# Patient Record
Sex: Female | Born: 1975
Health system: Southern US, Community
[De-identification: ages and names within clinical notes are randomized; demographics above are authoritative.]

## PROBLEM LIST (undated history)

## (undated) DIAGNOSIS — I10 Essential (primary) hypertension: Secondary | ICD-10-CM

## (undated) DIAGNOSIS — J302 Other seasonal allergic rhinitis: Secondary | ICD-10-CM

---

## 2006-08-01 ENCOUNTER — Other Ambulatory Visit: Admission: RE | Admit: 2006-08-01 | Discharge: 2006-08-01 | Payer: Self-pay | Admitting: Obstetrics and Gynecology

## 2007-11-26 ENCOUNTER — Other Ambulatory Visit: Admission: RE | Admit: 2007-11-26 | Discharge: 2007-11-26 | Payer: Self-pay | Admitting: Obstetrics and Gynecology

## 2008-07-29 ENCOUNTER — Other Ambulatory Visit: Admission: RE | Admit: 2008-07-29 | Discharge: 2008-07-29 | Payer: Self-pay | Admitting: Obstetrics and Gynecology

## 2009-02-02 ENCOUNTER — Inpatient Hospital Stay (HOSPITAL_COMMUNITY): Admission: AD | Admit: 2009-02-02 | Discharge: 2009-02-05 | Payer: Self-pay | Admitting: Obstetrics and Gynecology

## 2009-09-17 ENCOUNTER — Other Ambulatory Visit: Admission: RE | Admit: 2009-09-17 | Discharge: 2009-09-17 | Payer: Self-pay | Admitting: Obstetrics and Gynecology

## 2010-05-17 LAB — RH IMMUNE GLOB WKUP(>/=20WKS)(NOT WOMEN'S HOSP)

## 2010-05-17 LAB — CBC
HCT: 42.8 % (ref 36.0–46.0)
Hemoglobin: 14.4 g/dL (ref 12.0–15.0)
Hemoglobin: 14.6 g/dL (ref 12.0–15.0)
MCHC: 33.7 g/dL (ref 30.0–36.0)
MCHC: 34.1 g/dL (ref 30.0–36.0)
MCV: 95.3 fL (ref 78.0–100.0)
MCV: 96 fL (ref 78.0–100.0)
MCV: 97 fL (ref 78.0–100.0)
Platelets: 154 10*3/uL (ref 150–400)
RBC: 4.45 MIL/uL (ref 3.87–5.11)
RDW: 14 % (ref 11.5–15.5)
RDW: 14.1 % (ref 11.5–15.5)
WBC: 12.8 10*3/uL — ABNORMAL HIGH (ref 4.0–10.5)

## 2010-11-22 ENCOUNTER — Other Ambulatory Visit (HOSPITAL_COMMUNITY)
Admission: RE | Admit: 2010-11-22 | Discharge: 2010-11-22 | Disposition: A | Discharge status: Home / Self Care | Payer: 59 | Source: Ambulatory Visit | Attending: Obstetrics and Gynecology | Admitting: Obstetrics and Gynecology

## 2010-11-22 ENCOUNTER — Other Ambulatory Visit: Payer: Self-pay | Admitting: Obstetrics and Gynecology

## 2010-11-22 DIAGNOSIS — Z01419 Encounter for gynecological examination (general) (routine) without abnormal findings: Secondary | ICD-10-CM | POA: Insufficient documentation

## 2011-12-16 ENCOUNTER — Ambulatory Visit: Payer: 59

## 2011-12-16 ENCOUNTER — Ambulatory Visit
Admission: RE | Admit: 2011-12-16 | Discharge: 2011-12-16 | Disposition: A | Discharge status: Home / Self Care | Payer: 59 | Source: Ambulatory Visit | Attending: Family Medicine | Admitting: Family Medicine

## 2011-12-16 ENCOUNTER — Ambulatory Visit (INDEPENDENT_AMBULATORY_CARE_PROVIDER_SITE_OTHER): Payer: No Typology Code available for payment source | Admitting: Family Medicine

## 2011-12-16 VITALS — BP 118/78 | HR 84 | Temp 98.5°F | Resp 18 | Ht 63.5 in | Wt 150.2 lb

## 2011-12-16 DIAGNOSIS — R109 Unspecified abdominal pain: Secondary | ICD-10-CM

## 2011-12-16 DIAGNOSIS — R110 Nausea: Secondary | ICD-10-CM

## 2011-12-16 LAB — POCT CBC
HCT, POC: 49.9 % — AB (ref 37.7–47.9)
Lymph, poc: 1.3 (ref 0.6–3.4)
MCHC: 32.1 g/dL (ref 31.8–35.4)
MID (cbc): 0.5 (ref 0–0.9)
MPV: 9 fL (ref 0–99.8)
POC Granulocyte: 7.4 — AB (ref 2–6.9)
POC LYMPH PERCENT: 14.1 %L (ref 10–50)
POC MID %: 5 %M (ref 0–12)
Platelet Count, POC: 326 10*3/uL (ref 142–424)
RDW, POC: 12.8 %

## 2011-12-16 LAB — POCT URINALYSIS DIPSTICK
Bilirubin, UA: NEGATIVE
Leukocytes, UA: NEGATIVE
Nitrite, UA: NEGATIVE
Protein, UA: NEGATIVE
pH, UA: 5.5

## 2011-12-16 LAB — POCT UA - MICROSCOPIC ONLY: Yeast, UA: NEGATIVE

## 2011-12-16 MED ORDER — IOHEXOL 300 MG/ML  SOLN
30.0000 mL | Freq: Once | INTRAMUSCULAR | Status: AC | PRN
  Administered 2011-12-16: 30 mL via ORAL

## 2011-12-16 MED ORDER — IOHEXOL 300 MG/ML  SOLN
100.0000 mL | Freq: Once | INTRAMUSCULAR | Status: AC | PRN
  Administered 2011-12-16: 100 mL via INTRAVENOUS

## 2011-12-16 MED ORDER — ONDANSETRON HCL 8 MG PO TABS: 8.0000 mg | ORAL_TABLET | Freq: Three times a day (TID) | ORAL | Status: DC | PRN

## 2011-12-16 NOTE — Progress Notes (Signed)
[facility] [address] [place] [address] [phone]  Date:  12/16/2011   Name:  Erin Meadows   DOB:  10/10/1975   MRN:  [id number]  PCP:  No primary provider on file.    Chief Complaint: Abdominal Pain and Diarrhea   History of Present Illness:  Erin Meadows is a 36 y.o. very pleasant female patient who presents with the following:  A few weeks ago she noted abdominal pain and bloating after she ate a salad.  This got better after a day or so.   Her symptoms returned just about a week ago.  It was not as severe at the beginning, but has gotten worse over the last couple of days.   Yesterday she felt nauseaed.  No vomiting.  Her belly has felt bloated and uncomfortable. Last night she began to notice diarrhea- she had about 3 episodes.  No blood, no melena.    She has noted decreased appetite for the last week.   She has tried some OTC antiacid medication.   Her pain is more in the epigastric area.  Eating does not really change her sympotms.    She has never had any surgery. She did note some chills yesterday, but no fever.   No urinary or vaginal symptoms.  No family history of gallbladder or other GI problems. She last had diarrhea about 8:30 today. She did eat a nutra- grain bar this am.   No burping or heartburn.   She is generally quite healthy  LMP was 2 weeks ago.  She is married and has a 40 year old son.  There is no problem list on file for this patient.   History reviewed. No pertinent past medical history.  History reviewed. No pertinent past surgical history.  History  Substance Use Topics  . Smoking status: Never Smoker   . Smokeless tobacco: Not on file  . Alcohol Use: Not on file    Family History  Problem Relation Age of Onset  . Hypertension Father   . Heart attack Father   . Hypertension Brother     No Known Allergies  Medication list has been reviewed and updated.  Current Outpatient Prescriptions on  File Prior to Visit  Medication Sig Dispense Refill  . norethindrone-ethinyl estradiol-iron (ESTROSTEP FE,TILIA FE,TRI-LEGEST FE) 1-20/1-30/1-35 MG-MCG tablet Take 1 tablet by mouth daily.        Review of Systems:  As per HPI- otherwise negative.   Physical Examination: Filed Vitals:   12/16/11 0952  BP: 162/98  Pulse: 110  Temp: 98.5 F (36.9 C)  Resp: 18   Filed Vitals:   12/16/11 0952  Height: 5' 3.5" (1.613 m)  Weight: 150 lb 3.2 oz (68.13 kg)   Body mass index is 26.19 kg/(m^2). Ideal Body Weight: Weight in (lb) to have BMI = 25: 143.1   GEN: WDWN, NAD, Non-toxic, A & O x 3 HEENT: Atraumatic, Normocephalic. Neck supple. No masses, No LAD.  Bilateral TM wnl, oropharynx normal.  PEERL,EOMI.   Ears and Nose: No external deformity. CV: RRR, No M/G/R. No JVD. No thrill. No extra heart sounds. PULM: CTA B, no wheezes, crackles, rhonchi. No retractions. No resp. distress. No accessory muscle use. ABD: S, ND, +BS.  No HSM.  She has generalized abdoiminal tenderness but it is worst in her RLQ.  She has slight rebound tendernes. On re-exam she also has epigastric and LLQ tenderness, RLQ tenderness absent but continues to have mild rebound  in the RLQ.   EXTR: No c/c/e NEURO Normal gait.  PSYCH: Normally interactive. Conversant. Not depressed or anxious appearing.  Calm demeanor.  Gu: normal external and internal exam, no CMT, no uterine or adnexal masses or tenderness  [facility] reading (PRIMARY) by  Dr. Patsy Lager.  Upright abdomen- normal, no obstructive pattern or free air  ABDOMEN - 1 VIEW  Comparison: No priors.  Findings: Gas and stool are seen scattered throughout the colon  extending to the level of the distal rectum. No pathologic  distension of small bowel is noted. No gross evidence of  pneumoperitoneum.  IMPRESSION:  1. Nonobstructive bowel gas pattern.  2. No pneumoperitoneum.   Results for orders placed in visit on 12/16/11  POCT CBC      Component Value Range    WBC 9.2  4.6 - 10.2 K/uL   Lymph, poc 1.3  0.6 - 3.4   POC LYMPH PERCENT 14.1  10 - 50 %L   MID (cbc) 0.5  0 - 0.9   POC MID % 5.0  0 - 12 %M   POC Granulocyte 7.4 (*) 2 - 6.9   Granulocyte percent 80.9 (*) 37 - 80 %G   RBC 5.22  4.04 - 5.48 M/uL   Hemoglobin 16.0  12.2 - 16.2 g/dL   HCT, POC 16.1 (*) 09.6 - 47.9 %   MCV 95.5  80 - 97 fL   MCH, POC 30.7  27 - 31.2 pg   MCHC 32.1  31.8 - 35.4 g/dL   RDW, POC 04.5     Platelet Count, POC 326  142 - 424 K/uL   MPV 9.0  0 - 99.8 fL  POCT UA - MICROSCOPIC ONLY      Component Value Range   WBC, Ur, HPF, POC 0-1     RBC, urine, microscopic 0-1     Bacteria, U Microscopic trace     Mucus, UA neg     Epithelial cells, urine per micros 3-7     Crystals, Ur, HPF, POC neg     Casts, Ur, LPF, POC neg     Yeast, UA neg    POCT URINALYSIS DIPSTICK      Component Value Range   Color, UA yellow     Clarity, UA clear     Glucose, UA neg     Bilirubin, UA neg     Ketones, UA neg     Spec Grav, UA >=1.030     Blood, UA trace-intact     pH, UA 5.5     Protein, UA neg     Urobilinogen, UA 0.2     Nitrite, UA neg     Leukocytes, UA Negative    POCT URINE PREGNANCY      Component Value Range   Preg Test, Ur Negative      Assessment and Plan: 1. Abdominal  pain, other specified site  POCT CBC, POCT UA - Microscopic Only, POCT urinalysis dipstick, POCT urine pregnancy, DG Abd 1 View, CT Abdomen Pelvis W Contrast   Annabelle has noted abdominal pain and symptoms in her RLQ for about 2 days. Her presentation is not classic for appendicitis but it is a possibility.  Discussed risks and benefits of CT scan- she would like to go ahead and do this today.  She will proceed to [facility] to have a CT now   COPLAND,JESSICA, MD   CT ABDOMEN AND PELVIS WITH CONTRAST  Technique: Multidetector CT imaging of the abdomen and  pelvis was performed following the standard protocol during bolus administration of intravenous contrast.  Contrast:  30mL OMNIPAQUE IOHEXOL 300 MG/ML SOLN, OMNIPAQUE IOHEXOL 300 MG/ML SOLN  Comparison: None.  Findings: Motion degraded images.  Lung bases are clear.  Liver, spleen, pancreas, and adrenal glands are within normal limits.  Gallbladder is underdistended. No intrahepatic or extrahepatic ductal dilatation.  Kidneys are within normal limits. No hydronephrosis.  No evidence of bowel obstruction. Normal appendix. Terminal ileum is within normal limits. Colon is decompressed but grossly unremarkable.  No evidence of abdominal aortic aneurysm.  No abdominopelvic ascites.  No suspicious abdominopelvic lymphadenopathy.  Uterus and bilateral ovaries are unremarkable.  Bladder is underdistended but within normal limits.  Visualized osseous structures are within normal limits.  IMPRESSION: Normal appendix. No evidence of bowel obstruction.  No CT findings to account for the patient's right lower quadrant abdominal pain.   Called and went over her report.  Everything looked ok- nothing to explain her pain.  I wonder if she may have IBS.  Will treat with zofran for her nausea.  I will check on her tomorrow. Meds ordered this encounter  Medications  . norethindrone-ethinyl estradiol-iron (ESTROSTEP FE,TILIA FE,TRI-LEGEST FE) 1-20/1-30/1-35 MG-MCG tablet    Sig: Take 1 tablet by mouth daily.  . ondansetron (ZOFRAN) 8 MG tablet    Sig: Take 1 tablet (8 mg total) by mouth every 8 (eight) hours as needed for nausea.    Dispense:  15 tablet    Refill:  0

## 2011-12-16 NOTE — Patient Instructions (Addendum)
Go for your CT scan today, to [facility]. You are a work in and you will probably have to wait for your exam. [facility] care has given prior authorization for the scan the Auth # [id number]   Driving directions to [address] [place], [place], [place] [address] [id number]  - more info    [address]  [place], [place] [address]     1. Head south on [address] toward [address]      0.5 mi    2. Sharp left onto [address]      0.6 mi    3. Turn left onto the [address] ramp      0.2 mi    4. Merge onto [address]      3.0 mi    5. Continue straight to stay on [address]      0.4 mi    6. Slight left to stay on [facility]  Destination will be on the right     1.0 mi     [address] [place], [place] [address]

## 2011-12-16 NOTE — Progress Notes (Signed)
I have called [facility] for prior auth/ care notification. The auth # for CT scan is [id number], I have called [facility] and they can not give her a scheduled time, but she can go to any of their imaging centers and be worked into the schedule.

## 2011-12-17 ENCOUNTER — Telehealth: Payer: Self-pay | Admitting: Family Medicine

## 2011-12-17 NOTE — Telephone Encounter (Signed)
She is feeling better.  No pain, but she does have some loose stools still

## 2011-12-19 ENCOUNTER — Telehealth: Payer: Self-pay | Admitting: Family Medicine

## 2011-12-19 NOTE — Telephone Encounter (Signed)
She is feeling fine

## 2012-02-23 ENCOUNTER — Other Ambulatory Visit: Payer: Self-pay | Admitting: Obstetrics and Gynecology

## 2012-02-23 ENCOUNTER — Other Ambulatory Visit (HOSPITAL_COMMUNITY)
Admission: RE | Admit: 2012-02-23 | Discharge: 2012-02-23 | Disposition: A | Discharge status: Home / Self Care | Payer: No Typology Code available for payment source | Source: Ambulatory Visit | Attending: Obstetrics and Gynecology | Admitting: Obstetrics and Gynecology

## 2012-02-23 DIAGNOSIS — Z1151 Encounter for screening for human papillomavirus (HPV): Secondary | ICD-10-CM | POA: Insufficient documentation

## 2012-02-23 DIAGNOSIS — R87810 Cervical high risk human papillomavirus (HPV) DNA test positive: Secondary | ICD-10-CM | POA: Insufficient documentation

## 2012-02-23 DIAGNOSIS — Z01419 Encounter for gynecological examination (general) (routine) without abnormal findings: Secondary | ICD-10-CM | POA: Insufficient documentation

## 2012-10-28 ENCOUNTER — Ambulatory Visit (INDEPENDENT_AMBULATORY_CARE_PROVIDER_SITE_OTHER): Payer: No Typology Code available for payment source | Admitting: Physician Assistant

## 2012-10-28 VITALS — BP 132/78 | HR 96 | Temp 98.7°F | Resp 16 | Ht 64.0 in | Wt 140.0 lb

## 2012-10-28 DIAGNOSIS — H6593 Unspecified nonsuppurative otitis media, bilateral: Secondary | ICD-10-CM

## 2012-10-28 DIAGNOSIS — H6590 Unspecified nonsuppurative otitis media, unspecified ear: Secondary | ICD-10-CM

## 2012-10-28 DIAGNOSIS — J020 Streptococcal pharyngitis: Secondary | ICD-10-CM

## 2012-10-28 DIAGNOSIS — H9203 Otalgia, bilateral: Secondary | ICD-10-CM

## 2012-10-28 DIAGNOSIS — J029 Acute pharyngitis, unspecified: Secondary | ICD-10-CM

## 2012-10-28 DIAGNOSIS — H9209 Otalgia, unspecified ear: Secondary | ICD-10-CM

## 2012-10-28 MED ORDER — FLUTICASONE PROPIONATE 50 MCG/ACT NA SUSP: 2.0000 | Freq: Every day | NASAL | Status: DC

## 2012-10-28 MED ORDER — AMOXICILLIN 875 MG PO TABS: 875.0000 mg | ORAL_TABLET | Freq: Two times a day (BID) | ORAL | Status: DC

## 2012-10-28 MED ORDER — PSEUDOEPHEDRINE HCL 60 MG PO TABS: 60.0000 mg | ORAL_TABLET | Freq: Four times a day (QID) | ORAL | Status: DC | PRN

## 2012-10-28 NOTE — Patient Instructions (Addendum)
Take the amoxicillin as directed.  Be sure to finish the full course even if you begin feeling better.  Plenty of fluids and rest.  If anyone else at home begins having sore throat, bring them in so we can test and possibly treat them.  Fluticasone nasal spray - 2 sprays each nostril twice daily to help relieve pressure in your ears.  Also use the pseudoephedrine every 6 hours for the next 2-3 days  Please let us know if any symptoms are worsening or not improving.   Strep Throat Strep throat is an infection of the throat caused by a bacteria named Streptococcus pyogenes. Your caregiver may call the infection streptococcal "tonsillitis" or "pharyngitis" depending on whether there are signs of inflammation in the tonsils or back of the throat. Strep throat is most common in children aged 5 15 years during the cold months of the year, but it can occur in people of any age during any season. This infection is spread from person to person (contagious) through coughing, sneezing, or other close contact. SYMPTOMS   Fever or chills.  Painful, swollen, red tonsils or throat.  Pain or difficulty when swallowing.  White or yellow spots on the tonsils or throat.  Swollen, tender lymph nodes or "glands" of the neck or under the jaw.  Red rash all over the body (rare). DIAGNOSIS  Many different infections can cause the same symptoms. A test must be done to confirm the diagnosis so the right treatment can be given. A "rapid strep test" can help your caregiver make the diagnosis in a few minutes. If this test is not available, a light swab of the infected area can be used for a throat culture test. If a throat culture test is done, results are usually available in a day or two. TREATMENT  Strep throat is treated with antibiotic medicine. HOME CARE INSTRUCTIONS   Gargle with 1 tsp of salt in 1 cup of warm water, 3 4 times per day or as needed for comfort.  Family members who also have a sore throat or  fever should be tested for strep throat and treated with antibiotics if they have the strep infection.  Make sure everyone in your household washes their hands well.  Do not share food, drinking cups, or personal items that could cause the infection to spread to others.  You may need to eat a soft food diet until your sore throat gets better.  Drink enough water and fluids to keep your urine clear or pale yellow. This will help prevent dehydration.  Get plenty of rest.  Stay home from school, daycare, or work until you have been on antibiotics for 24 hours.  Only take over-the-counter or prescription medicines for pain, discomfort, or fever as directed by your caregiver.  If antibiotics are prescribed, take them as directed. Finish them even if you start to feel better. SEEK MEDICAL CARE IF:   The glands in your neck continue to enlarge.  You develop a rash, cough, or earache.  You cough up green, yellow-brown, or bloody sputum.  You have pain or discomfort not controlled by medicines.  Your problems seem to be getting worse rather than better. SEEK IMMEDIATE MEDICAL CARE IF:   You develop any new symptoms such as vomiting, severe headache, stiff or painful neck, chest pain, shortness of breath, or trouble swallowing.  You develop severe throat pain, drooling, or changes in your voice.  You develop swelling of the neck, or the skin on  the neck becomes red and tender.  You have a fever.  You develop signs of dehydration, such as fatigue, dry mouth, and decreased urination.  You become increasingly sleepy, or you cannot wake up completely. Document Released: 01/29/2000 Document Revised: 01/18/2012 Document Reviewed: 04/01/2010 [facility] Patient Information 2014 [place], [place].

## 2012-10-28 NOTE — Progress Notes (Signed)
  Subjective:    Patient ID: Erin Meadows, female    DOB: October 10, 1975, 37 y.o.   MRN: [id number]  HPI   [name] is a very pleasant 37 yr old female here with concern for illness.  Reports 4 days of feeling "really bad."  First day may have had a fever but didn't check.  Throat very sore and both ears hurting a log.  Has used some OTC ear drops which helps somewhat.  No nasal congestion, rhinorrhea, cough, GI symptoms.  No sick contacts but she does work as a Runner, broadcasting/film/video.  Has used Dayquil and Nyquil for symptoms with some relief.    Review of Systems  Constitutional: Negative for fever and chills.  HENT: Positive for ear pain, congestion and sore throat.   Respiratory: Negative for cough, shortness of breath and wheezing.   Cardiovascular: Negative.   Gastrointestinal: Negative.   Musculoskeletal: Negative.   Skin: Negative.   Neurological: Negative.        Objective:   Physical Exam  Vitals reviewed. Constitutional: She is oriented to person, place, and time. She appears well-developed and well-nourished. No distress.  HENT:  Head: Normocephalic and atraumatic.  Right Ear: Ear canal normal. A middle ear effusion is present.  Left Ear: Ear canal normal. A middle ear effusion is present.  Mouth/Throat: Uvula is midline and mucous membranes are normal. Posterior oropharyngeal erythema present. No oropharyngeal exudate or posterior oropharyngeal edema.  Neck: Neck supple.  Cardiovascular: Normal rate, regular rhythm and normal heart sounds.   Pulmonary/Chest: Effort normal and breath sounds normal. She has no wheezes. She has no rales.  Lymphadenopathy:    She has no cervical adenopathy.  Neurological: She is alert and oriented to person, place, and time.  Skin: Skin is warm and dry.  Psychiatric: She has a normal mood and affect. Her behavior is normal.     Results for orders placed in visit on 10/28/12  POCT RAPID STREP A (OFFICE)      Result Value Range   Rapid Strep A Screen  Positive (*) Negative       Assessment & Plan:  Strep throat - Plan: amoxicillin (AMOXIL) 875 MG tablet  Sore throat - Plan: POCT rapid strep A, CANCELED: Culture, Group A Strep  Otalgia of both ears - Plan: fluticasone (FLONASE) 50 MCG/ACT nasal spray, pseudoephedrine (SUDAFED) 60 MG tablet  Middle ear effusion, bilateral - Plan: fluticasone (FLONASE) 50 MCG/ACT nasal spray, pseudoephedrine (SUDAFED) 60 MG tablet   [name] is a very pleasant 37 yr old female here with strep throat.  Will treat with amox x 10 days.  Ibuprofen prn throat pain.  Pt has a young son who is currently asympt - encouraged her to bring him in for test/tx if he develops symptoms.  Mid-ear effusions bilat but no sign of infection today.  Will treat with Flonase and Sudafed to hopefully facilitate drainage of the middle ear.  RTC if worsening or not improving.

## 2013-03-20 ENCOUNTER — Other Ambulatory Visit (HOSPITAL_COMMUNITY)
Admission: RE | Admit: 2013-03-20 | Discharge: 2013-03-20 | Disposition: A | Discharge status: Home / Self Care | Payer: No Typology Code available for payment source | Source: Ambulatory Visit | Attending: Obstetrics and Gynecology | Admitting: Obstetrics and Gynecology

## 2013-03-20 ENCOUNTER — Other Ambulatory Visit: Payer: Self-pay | Admitting: Obstetrics and Gynecology

## 2013-03-20 DIAGNOSIS — Z01419 Encounter for gynecological examination (general) (routine) without abnormal findings: Secondary | ICD-10-CM | POA: Insufficient documentation

## 2013-07-20 ENCOUNTER — Ambulatory Visit: Payer: No Typology Code available for payment source | Admitting: Family Medicine

## 2013-07-20 VITALS — BP 126/74 | HR 101 | Temp 98.2°F | Ht 63.0 in | Wt 147.0 lb

## 2013-07-20 DIAGNOSIS — J029 Acute pharyngitis, unspecified: Secondary | ICD-10-CM

## 2013-07-20 DIAGNOSIS — H109 Unspecified conjunctivitis: Secondary | ICD-10-CM

## 2013-07-20 LAB — POCT RAPID STREP A (OFFICE): Rapid Strep A Screen: NEGATIVE

## 2013-07-20 MED ORDER — TOBRAMYCIN 0.3 % OP SOLN: 1.0000 [drp] | OPHTHALMIC | Status: DC

## 2013-07-20 NOTE — Progress Notes (Signed)
Patient ID: Erin HightKimberly Meadows MRN: [id number], DOB: [birth year], 38 y.o. Date of Encounter: 07/20/2013, 8:21 AM  Primary Physician: No PCP Per Patient  Chief Complaint:  Chief Complaint  Patient presents with  . Conjunctivitis    watery and matted this morning  . Otalgia    pain since yesterday  . Sore Throat    pain since yesterday    HPI: 38 y.o. year old female presents with day history of sore throat. Subjective fever and chills. No cough, congestion, rhinorrhea, sinus pressure, otalgia, or headache. Normal hearing. No GI complaints. Able to swallow saliva, but hurts to do so. Decreased appetite secondary to sore throat.   Patient [facility] [employer] grades 4-8 in math. Aref patient woke up this morning with conjunctivitis in left eye  No past medical history on file.   Home Meds: Prior to Admission medications   Medication Sig Start Date End Date Taking? Authorizing Provider  norethindrone-ethinyl estradiol-iron (ESTROSTEP FE,TILIA FE,TRI-LEGEST FE) 1-20/1-30/1-35 MG-MCG tablet Take 1 tablet by mouth daily.   Yes Historical Provider, MD  amoxicillin (AMOXIL) 875 MG tablet Take 1 tablet (875 mg total) by mouth 2 (two) times daily. 10/28/12   Eleanore Delia ChimesE Egan, PA-C  fluticasone (FLONASE) 50 MCG/ACT nasal spray Place 2 sprays into the nose daily. 10/28/12   Eleanore Delia ChimesE Egan, PA-C  pseudoephedrine (SUDAFED) 60 MG tablet Take 1 tablet (60 mg total) by mouth every 6 (six) hours as needed for congestion. 10/28/12   Eleanore Delia ChimesE Egan, PA-C    Allergies: No Known Allergies  History   Social History  . Marital Status: Married    Spouse Name: N/A    Number of Children: N/A  . Years of Education: N/A   Occupational History  . Not on file.   Social History Main Topics  . Smoking status: Never Smoker   . Smokeless tobacco: Not on file  . Alcohol Use: Not on file  . Drug Use: Not on file  . Sexual Activity: Not on file   Other Topics Concern  . Not on file   Social  History Narrative  . No narrative on file     Review of Systems: Constitutional: negative for chills, fever, night sweats or weight changes HEENT: see above Cardiovascular: negative for chest pain or palpitations Respiratory: negative for hemoptysis, wheezing, or shortness of breath Abdominal: negative for abdominal pain, nausea, vomiting or diarrhea Dermatological: negative for rash Neurologic: negative for headache   Physical Exam: Blood pressure 126/74, pulse 101, temperature 98.2 F (36.8 C), temperature source Oral, height 5\' 3"  (1.6 m), weight 147 lb (66.679 kg), last menstrual period 07/16/2013., Body mass index is 26.05 kg/(m^2). General: Well developed, well nourished, in no acute distress. Head: Normocephalic, atraumatic, eyes without discharge, sclera non-icteric, nares are patent. Bilateral auditory canals clear, TM's are without perforation, pearly grey with reflective cone of light bilaterally. No sinus TTP. Oral cavity moist, dentition normal. Posterior pharynx with post nasal drip and mild erythema. No peritonsillar abscess or tonsillar exudate. Patient has marked erythema, lip swelling and exudates in the left eye Neck: Supple. No thyromegaly. Full ROM. No lymphadenopathy.  Msk:  Strength and tone normal for age. Extremities: No clubbing or cyanosis. No edema. Neuro: Alert and oriented X 3. Moves all extremities spontaneously. CNII-XII grossly in tact. Psych:  Responds to questions appropriately with a normal affect.     ASSESSMENT AND PLAN:  38 y.o. year old female with acute pharyngitis and conjunctivitis  - -Tylenol/Motrin  prn -Rest/fluids -RTC precautions -RTC 3-5 days if no improvement  Signed, Elvina Sidle, MD 07/20/2013 8:21 AM

## 2013-07-20 NOTE — Patient Instructions (Signed)

## 2013-07-28 ENCOUNTER — Ambulatory Visit (INDEPENDENT_AMBULATORY_CARE_PROVIDER_SITE_OTHER): Payer: No Typology Code available for payment source | Admitting: Physician Assistant

## 2013-07-28 VITALS — BP 120/73 | HR 79 | Temp 99.0°F | Resp 18 | Wt 145.0 lb

## 2013-07-28 DIAGNOSIS — R059 Cough, unspecified: Secondary | ICD-10-CM

## 2013-07-28 DIAGNOSIS — J029 Acute pharyngitis, unspecified: Secondary | ICD-10-CM

## 2013-07-28 DIAGNOSIS — H6690 Otitis media, unspecified, unspecified ear: Secondary | ICD-10-CM

## 2013-07-28 DIAGNOSIS — R05 Cough: Secondary | ICD-10-CM

## 2013-07-28 MED ORDER — AMOXICILLIN 875 MG PO TABS: 875.0000 mg | ORAL_TABLET | Freq: Two times a day (BID) | ORAL | Status: DC

## 2013-07-28 MED ORDER — HYDROCODONE-HOMATROPINE 5-1.5 MG/5ML PO SYRP: 5.0000 mL | ORAL_SOLUTION | Freq: Three times a day (TID) | ORAL | Status: DC | PRN

## 2013-07-28 MED ORDER — METHYLPREDNISOLONE (PAK) 4 MG PO TABS: ORAL_TABLET | ORAL | Status: DC

## 2013-07-28 NOTE — Progress Notes (Signed)
   Subjective:    Patient ID: Erin Meadows, female    DOB: 1975/08/08, 38 y.o.   MRN: [id number]  HPI 38 year old female presents for recheck of illness. Was seen on 6/6 for sore throat and left ear pain as well as conjunctivitis. Had negative rapid strep and was placed on Tobrex drops. She used them until her eye improved and then d/c'ed.  The next day her eye became red and started draining again so she started using them again.  Admits that her sore throat and ear pain have not improved. She complains of mucous in her throat and persistent, dry, hacking cough.  No SOB, wheezing, chest pain, or hemoptysis.   Denies fever, chills, nausea, vomiting, headache, dizziness, or abdominal pain.   Taking Mucinex at onset - not helping Antihistamine daily seems ineffective.    Review of Systems  Constitutional: Negative for fever and chills.  HENT: Positive for ear pain (left), postnasal drip and sore throat. Negative for congestion, rhinorrhea and sinus pressure.   Respiratory: Positive for cough. Negative for chest tightness, shortness of breath and wheezing.   Cardiovascular: Negative for chest pain.  Gastrointestinal: Negative for nausea, vomiting and abdominal pain.  Neurological: Negative for dizziness and headaches.       Objective:   Physical Exam  Constitutional: She is oriented to person, place, and time. She appears well-developed and well-nourished.  HENT:  Head: Normocephalic and atraumatic.  Right Ear: Tympanic membrane, external ear and ear canal normal.  Left Ear: Hearing, external ear and ear canal normal. Tympanic membrane is bulging. A middle ear effusion is present.  Mouth/Throat: Uvula is midline and mucous membranes are normal. Posterior oropharyngeal erythema present. No oropharyngeal exudate, posterior oropharyngeal edema or tonsillar abscesses.  Eyes: Conjunctivae are normal.  Neck: Normal range of motion. Neck supple.  Cardiovascular: Normal rate, regular rhythm and  normal heart sounds.   Pulmonary/Chest: Effort normal and breath sounds normal.  Lymphadenopathy:    She has no cervical adenopathy.  Neurological: She is alert and oriented to person, place, and time.  Psychiatric: She has a normal mood and affect. Her behavior is normal. Judgment and thought content normal.          Assessment & Plan:  Otitis media - Plan: amoxicillin (AMOXIL) 875 MG tablet  Cough - Plan: HYDROcodone-homatropine (HYCODAN) 5-1.5 MG/5ML syrup, methylPREDNIsolone (MEDROL DOSPACK) 4 MG tablet  Acute pharyngitis - Plan: amoxicillin (AMOXIL) 875 MG tablet  Will treat with amoxicillin 875 mg bid x 10 days Hycodan qhs prn cough - caution sedation Medrol dose pack as directed Increase fluids and rest Follow up if symptoms worsening or fail to improve.

## 2013-08-02 ENCOUNTER — Ambulatory Visit (INDEPENDENT_AMBULATORY_CARE_PROVIDER_SITE_OTHER): Payer: No Typology Code available for payment source | Admitting: Physician Assistant

## 2013-08-02 VITALS — BP 114/60 | HR 80 | Temp 98.6°F | Ht 63.5 in | Wt 144.6 lb

## 2013-08-02 DIAGNOSIS — H6990 Unspecified Eustachian tube disorder, unspecified ear: Secondary | ICD-10-CM

## 2013-08-02 DIAGNOSIS — H6980 Other specified disorders of Eustachian tube, unspecified ear: Secondary | ICD-10-CM

## 2013-08-02 DIAGNOSIS — H6982 Other specified disorders of Eustachian tube, left ear: Secondary | ICD-10-CM

## 2013-08-02 DIAGNOSIS — H6992 Unspecified Eustachian tube disorder, left ear: Secondary | ICD-10-CM

## 2013-08-02 MED ORDER — IPRATROPIUM BROMIDE 0.03 % NA SOLN: 2.0000 | Freq: Two times a day (BID) | NASAL | Status: DC

## 2013-08-02 NOTE — Progress Notes (Signed)
   Subjective:    Patient ID: Erin Meadows, female    DOB: 1975/12/02, 38 y.o.   MRN: [id number]  HPI 38 year old female presents for recheck of otitis media that was diagnosed on 07/28/13.  States her other URI symptoms have improved but she continues to have left ear pain.  Admits it is some better but not resolved.       Review of Systems  Constitutional: Negative for fever and chills.  HENT: Positive for ear pain (left). Negative for congestion, ear discharge, sinus pressure and sore throat.   Eyes: Negative for discharge and visual disturbance.  Gastrointestinal: Negative for nausea and vomiting.  Neurological: Negative for dizziness and headaches.       Objective:   Physical Exam  Constitutional: She is oriented to person, place, and time. She appears well-developed and well-nourished.  HENT:  Head: Normocephalic and atraumatic.  Right Ear: Hearing, tympanic membrane, external ear and ear canal normal.  Left Ear: Hearing, tympanic membrane, external ear and ear canal normal.  Mouth/Throat: Uvula is midline, oropharynx is clear and moist and mucous membranes are normal.  Eyes: Conjunctivae are normal.  Neck: Normal range of motion.  Cardiovascular: Normal rate.   Pulmonary/Chest: Effort normal.  Neurological: She is alert and oriented to person, place, and time.  Psychiatric: She has a normal mood and affect. Her behavior is normal. Judgment and thought content normal.    Several drops of Auralgan placed in left ear. She reports this did not help her symptoms at all.       Assessment & Plan:  ETD (eustachian tube dysfunction), left - Plan: ipratropium (ATROVENT) 0.03 % nasal spray  Due to no response to Auralgan, likely secondary to ETD. Her OM does appear to be improved; TM no longer erythematous or bulging.  Will add Atrovent NS twice daily. Recommend Zyrtec-D x 2-3 days. Complete course of antibiotic.  Reassurance provided.

## 2013-10-27 IMAGING — CR DG ABDOMEN 1V
2 series · 2 of 2 positions shown · non-contrast
Comparison: No priors.

CLINICAL DATA: Abdominal pain.  Rule out obstruction.

ABDOMEN - 1 VIEW

[AP (1 of 2)]
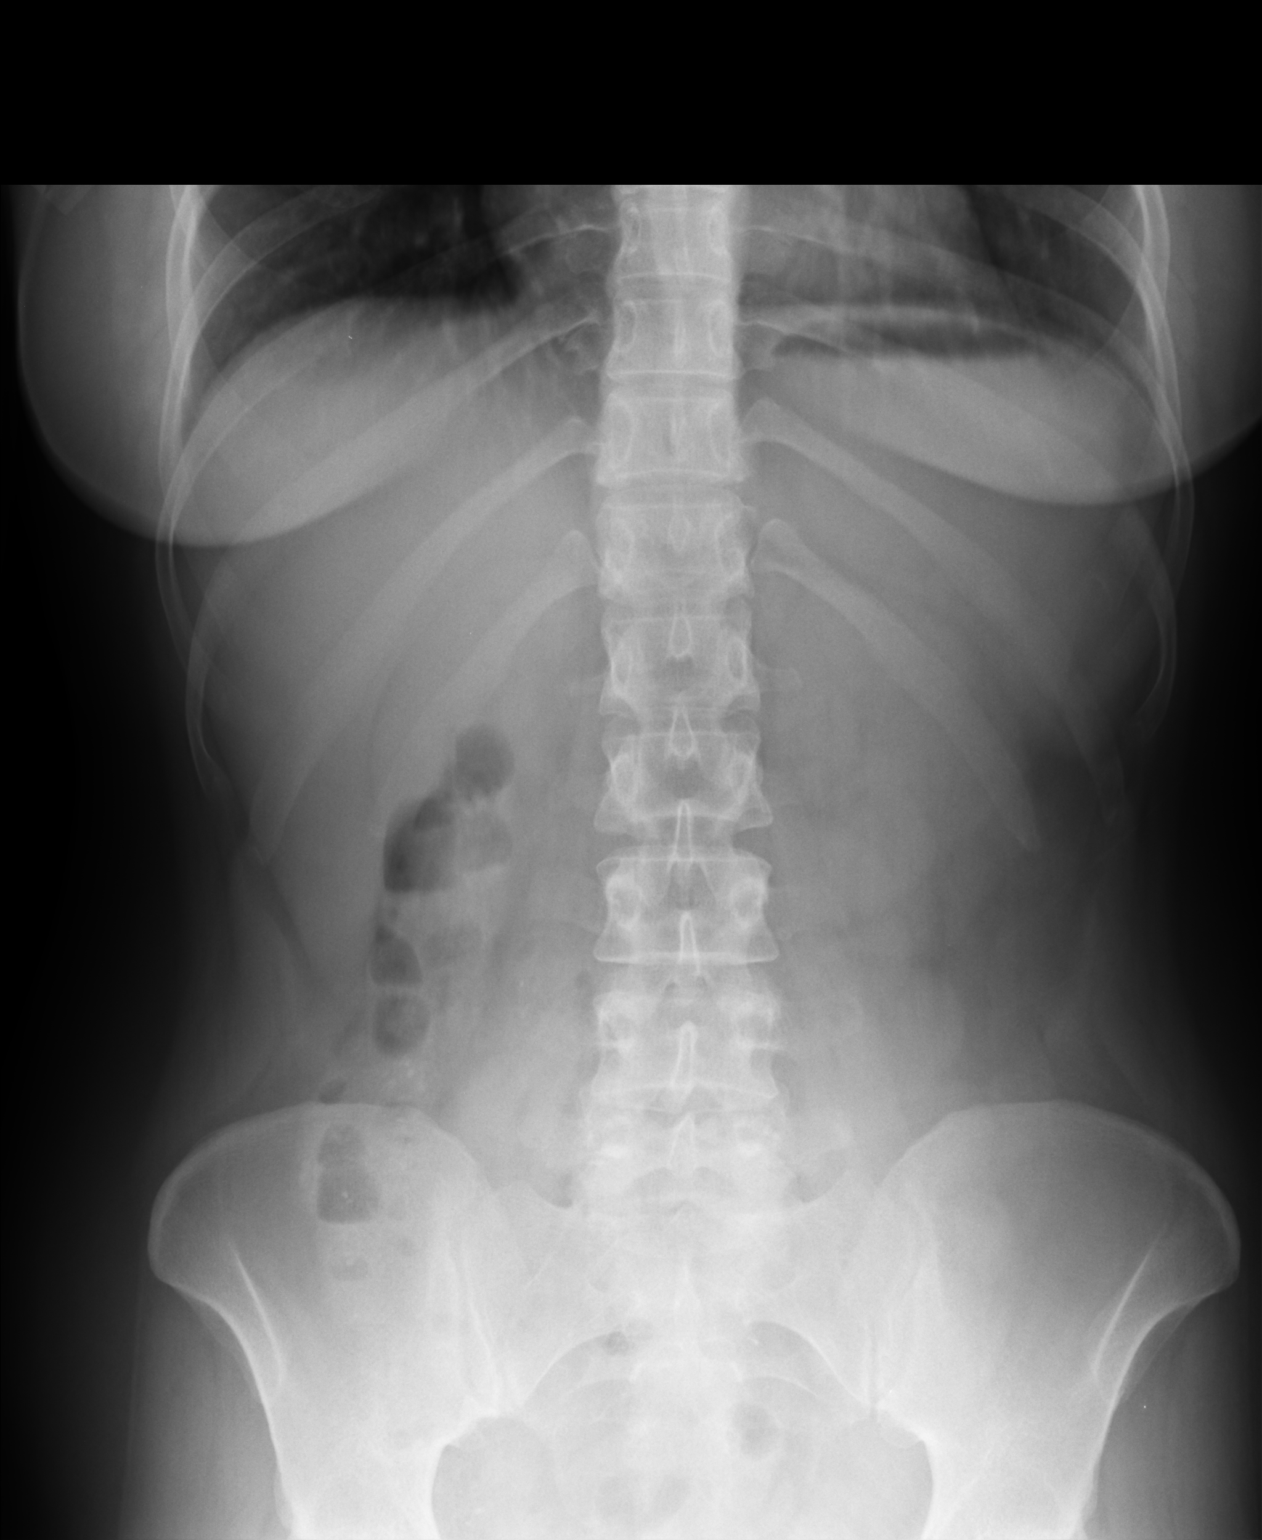

[AP (2 of 2)]
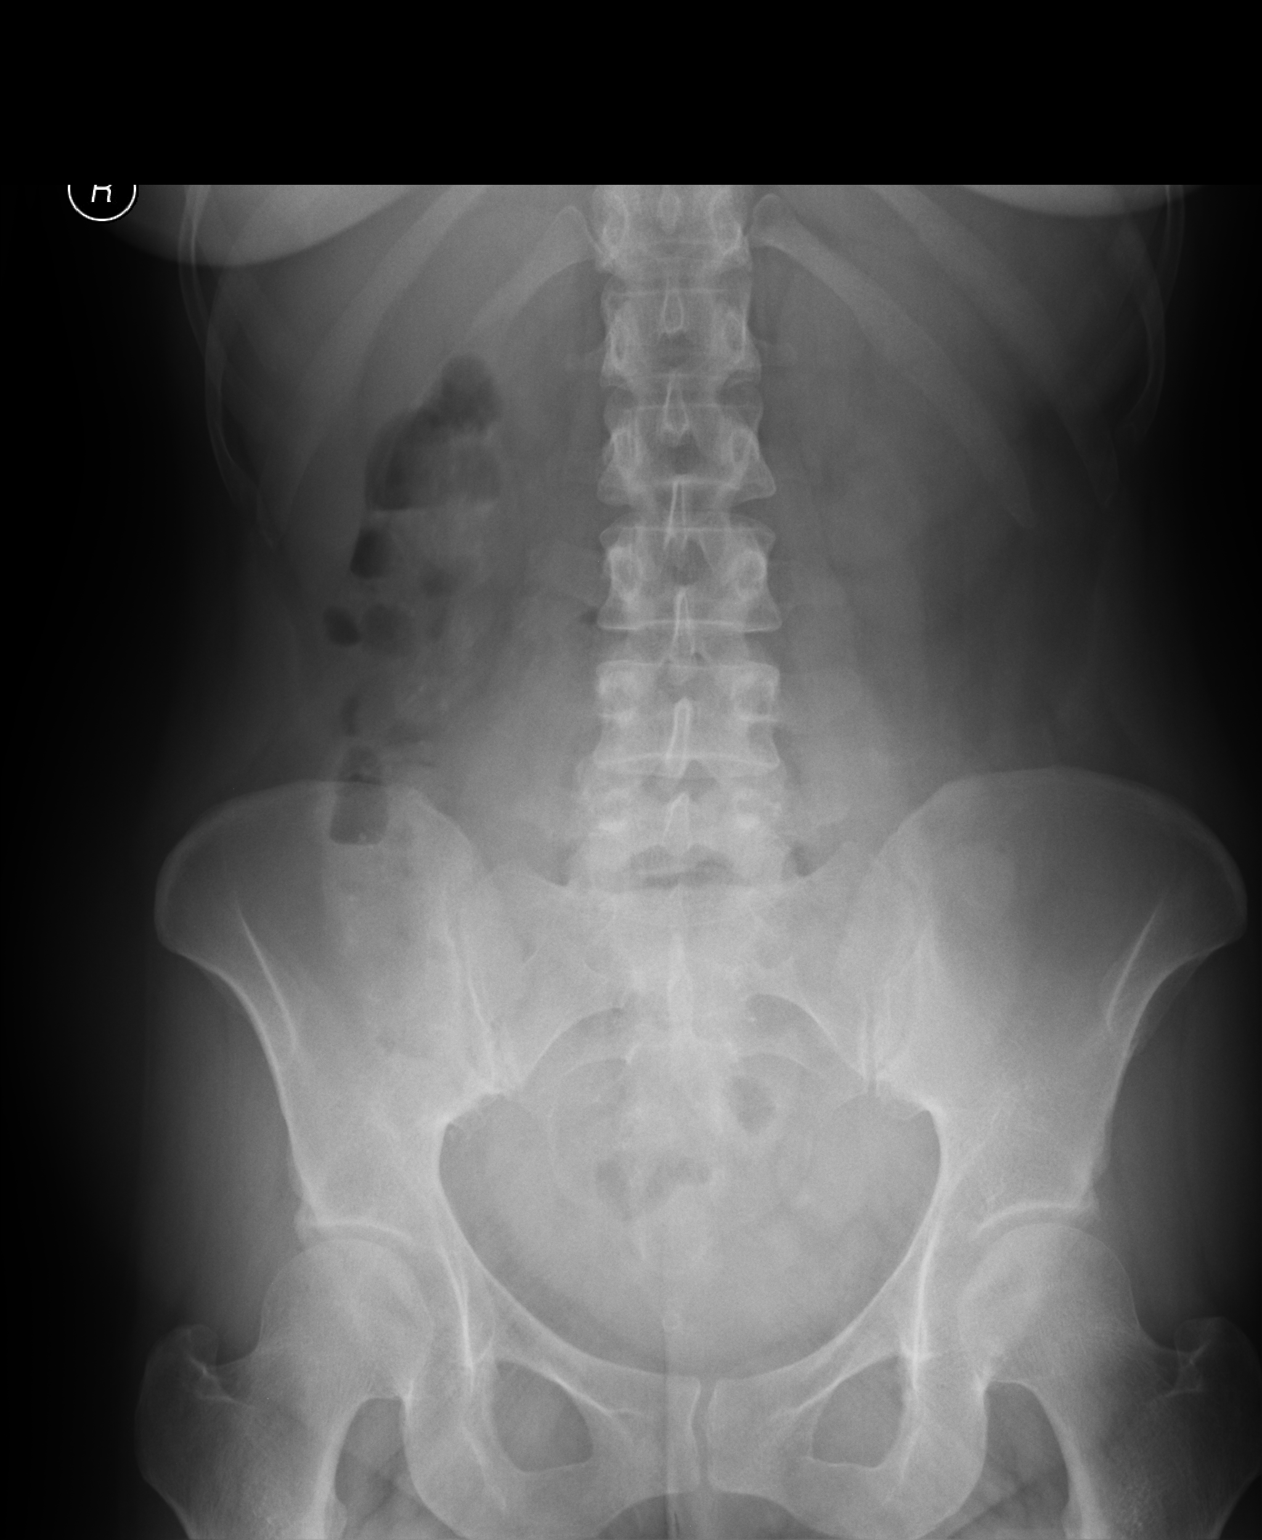

[2 of 2 positions shown; findings below may reference images not displayed]

FINDINGS: Gas and stool are seen scattered throughout the colon
extending to the level of the distal rectum.  No pathologic
distension of small bowel is noted.  No gross evidence of
pneumoperitoneum.
IMPRESSION: 1.  Nonobstructive bowel gas pattern.
2.  No pneumoperitoneum.

## 2013-10-27 IMAGING — CT CT ABD-PELV W/ CM
2 of 4 series · 13 of 32 positions shown, 18 images · IV contrast (omnipaque)
Comparison: None.

CLINICAL DATA: Right lower quadrant pain, nausea, diarrhea

CT ABDOMEN AND PELVIS WITH CONTRAST
TECHNIQUE: Multidetector CT imaging of the abdomen and pelvis was
performed following the standard protocol during bolus
administration of intravenous contrast.
Contrast: 30mL OMNIPAQUE IOHEXOL 300 MG/ML  SOLN, 100mL OMNIPAQUE
IOHEXOL 300 MG/ML  SOLN

[Series 2: abd/pelvis with · axial · 0.70mm/px · z∈[-312,-62]mm · 5 of 76 slices shown, 10 images]
[im 13/76  soft-tissue]
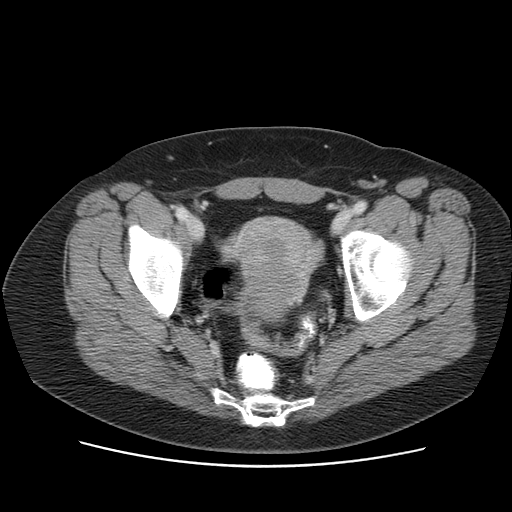
[im 13/76  bone]
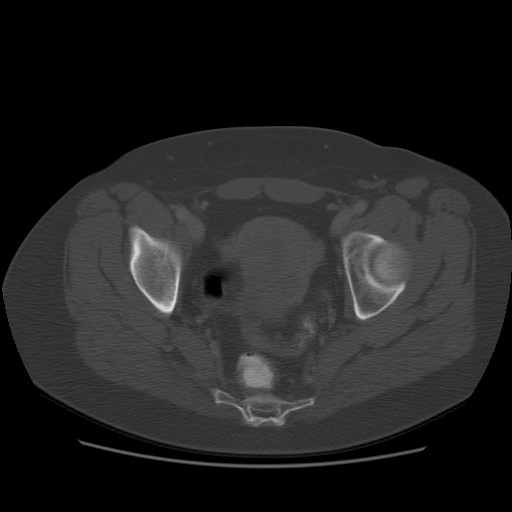
[im 26/76  soft-tissue]
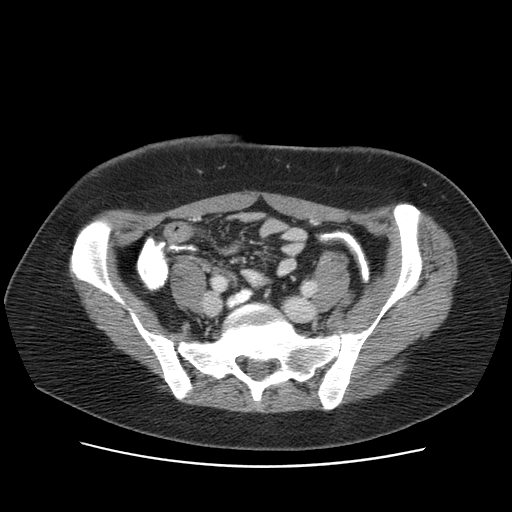
[im 26/76  lung]
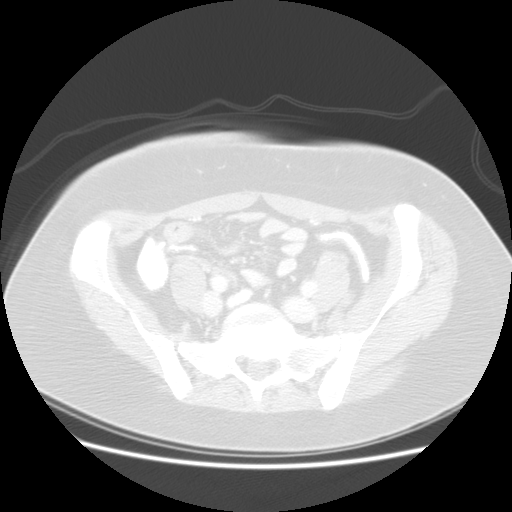
[im 38/76  soft-tissue]
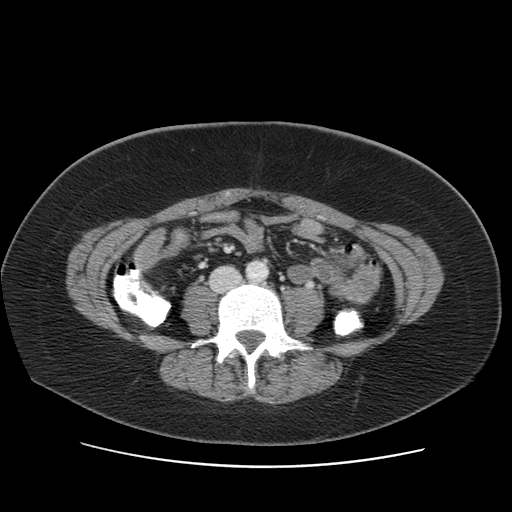
[im 38/76  lung]
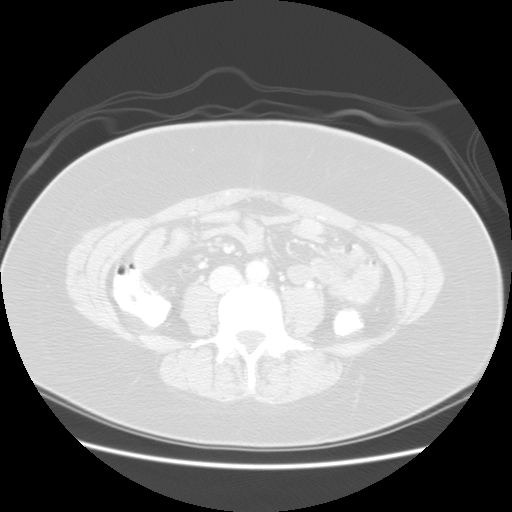
[im 51/76  soft-tissue]
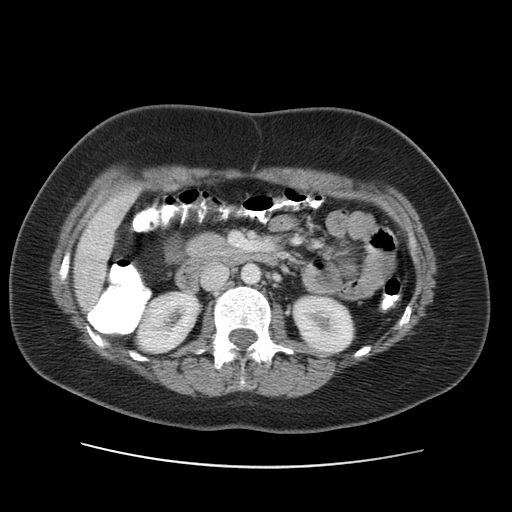
[im 51/76  lung]
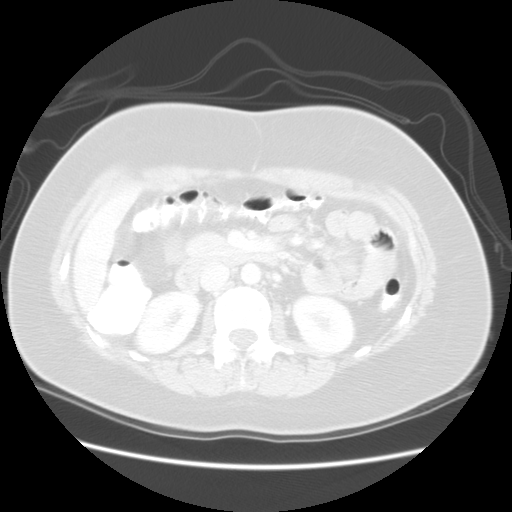
[im 63/76  soft-tissue]
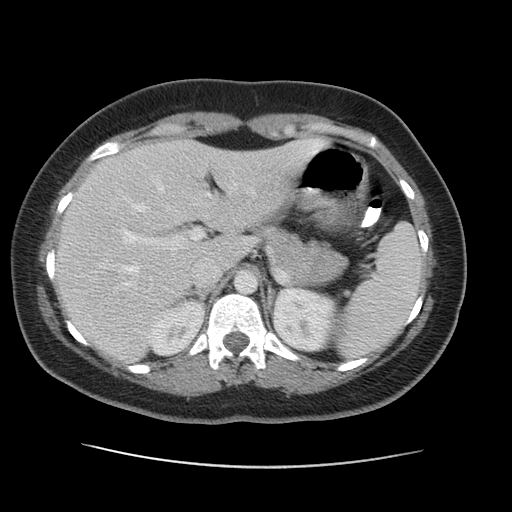
[im 63/76  lung]
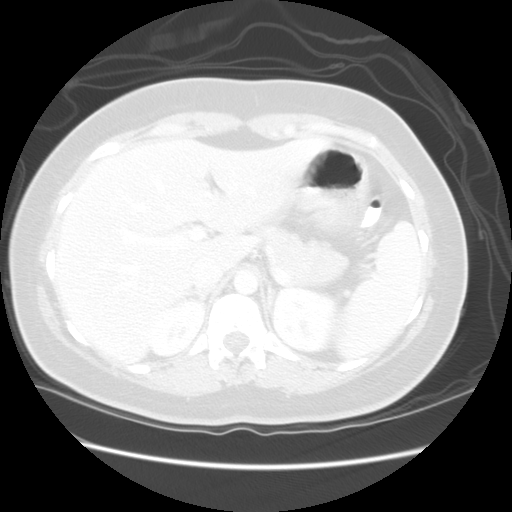

[Series 400: sag · sagittal · 0.82mm/px · 8 of 141 slices shown]
[im 11/141  soft-tissue]
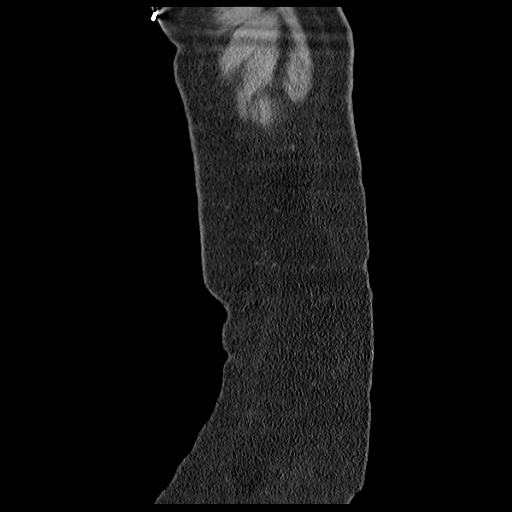
[im 33/141  soft-tissue]
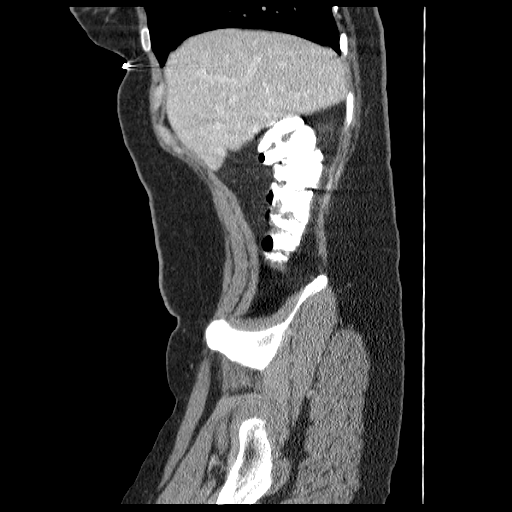
[im 44/141  soft-tissue]
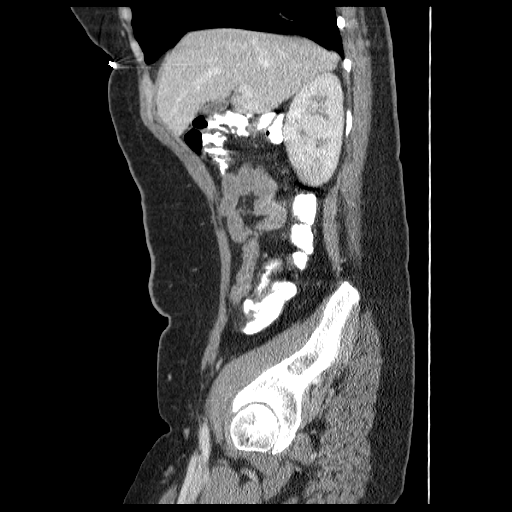
[im 65/141  soft-tissue]
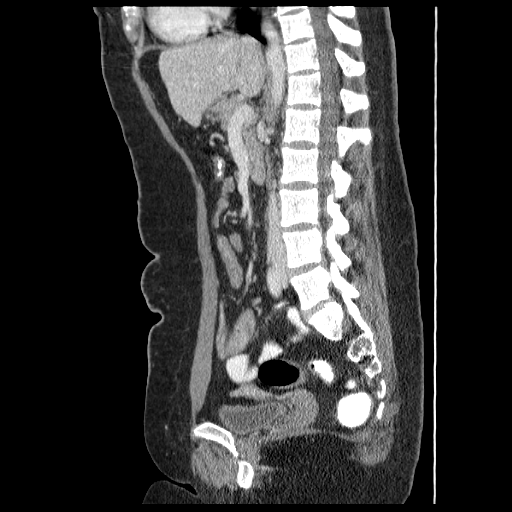
[im 76/141  soft-tissue]
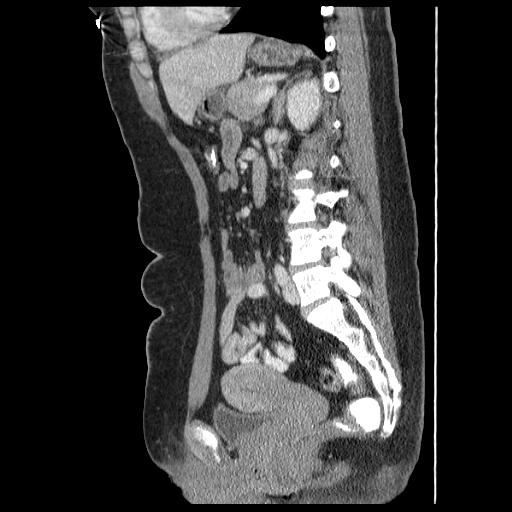
[im 97/141  soft-tissue]
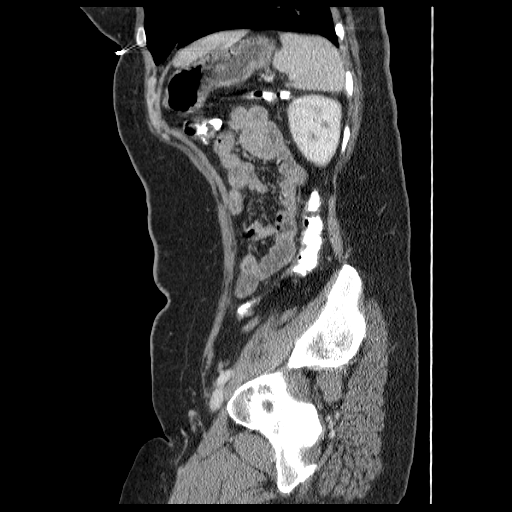
[im 108/141  soft-tissue]
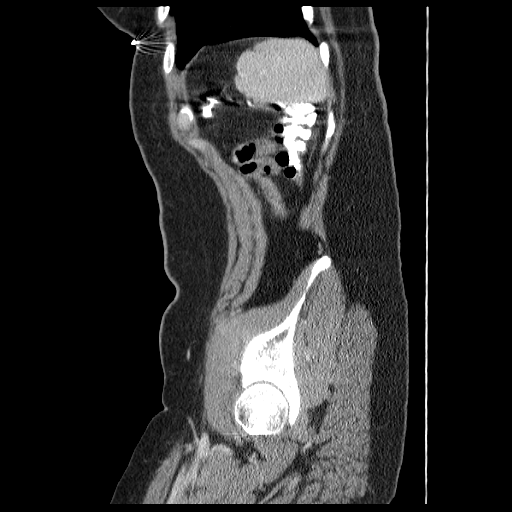
[im 130/141  soft-tissue]
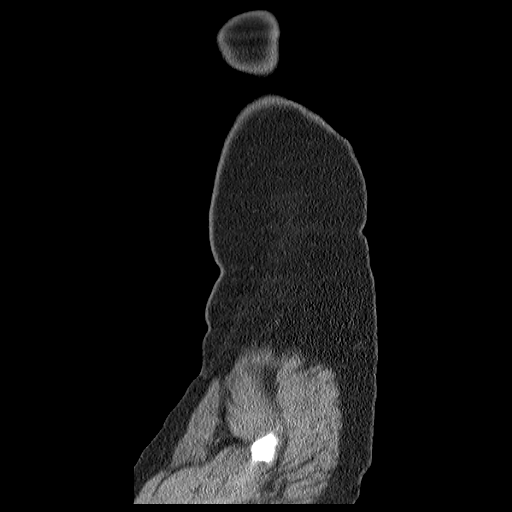

[13 of 32 positions shown; findings below may reference images not displayed]

FINDINGS: Motion degraded images.

Lung bases are clear.

Liver, spleen, pancreas, and adrenal glands are within normal
limits.

Gallbladder is underdistended.  No intrahepatic or extrahepatic
ductal dilatation.

Kidneys are within normal limits.  No hydronephrosis.

No evidence of bowel obstruction.  Normal appendix.  Terminal ileum
is within normal limits.  Colon is decompressed but grossly
unremarkable.

No evidence of abdominal aortic aneurysm.

No abdominopelvic ascites.

No suspicious abdominopelvic lymphadenopathy.

Uterus and bilateral ovaries are unremarkable.

Bladder is underdistended but within normal limits.

Visualized osseous structures are within normal limits.
IMPRESSION: Normal appendix.  No evidence of bowel obstruction.

No CT findings to account for the patient's right lower quadrant
abdominal pain.

## 2014-02-04 ENCOUNTER — Ambulatory Visit (INDEPENDENT_AMBULATORY_CARE_PROVIDER_SITE_OTHER): Payer: No Typology Code available for payment source | Admitting: Physician Assistant

## 2014-02-04 VITALS — BP 128/80 | HR 87 | Temp 98.1°F | Resp 18 | Ht 64.0 in | Wt 152.0 lb

## 2014-02-04 DIAGNOSIS — M62838 Other muscle spasm: Secondary | ICD-10-CM

## 2014-02-04 MED ORDER — CYCLOBENZAPRINE HCL 10 MG PO TABS: 10.0000 mg | ORAL_TABLET | Freq: Every day | ORAL | Status: DC

## 2014-02-04 NOTE — Progress Notes (Signed)
The patient was discussed with me and I agree with the diagnosis and treatment plan.  

## 2014-02-04 NOTE — Patient Instructions (Addendum)
You may take 5-10mg  Flexeril at night for muscle spasms/cramps. Please make sure you hydrate well, with at least 64 ounces of water daily. You can also eat 1/2 banana a day to help with potassium intake. If your leg pain does not improve in the next week, please return for further evaluation.     Muscle Cramps and Spasms Muscle cramps and spasms occur when a muscle or muscles tighten and you have no control over this tightening (involuntary muscle contraction). They are a common problem and can develop in any muscle. The most common place is in the calf muscles of the leg. Both muscle cramps and muscle spasms are involuntary muscle contractions, but they also have differences:   Muscle cramps are sporadic and painful. They may last a few seconds to a quarter of an hour. Muscle cramps are often more forceful and last longer than muscle spasms.  Muscle spasms may or may not be painful. They may also last just a few seconds or much longer. CAUSES  It is uncommon for cramps or spasms to be due to a serious underlying problem. In many cases, the cause of cramps or spasms is unknown. Some common causes are:   Overexertion.   Overuse from repetitive motions (doing the same thing over and over).   Remaining in a certain position for a long period of time.   Improper preparation, form, or technique while performing a sport or activity.   Dehydration.   Injury.   Side effects of some medicines.   Abnormally low levels of the salts and ions in your blood (electrolytes), especially potassium and calcium. This could happen if you are taking water pills (diuretics) or you are pregnant.  Some underlying medical problems can make it more likely to develop cramps or spasms. These include, but are not limited to:   Diabetes.   Parkinson disease.   Hormone disorders, such as thyroid problems.   Alcohol abuse.   Diseases specific to muscles, joints, and bones.   Blood vessel disease  where not enough blood is getting to the muscles.  HOME CARE INSTRUCTIONS   Stay well hydrated. Drink enough water and fluids to keep your urine clear or pale yellow.  It may be helpful to massage, stretch, and relax the affected muscle.  For tight or tense muscles, use a warm towel, heating pad, or hot shower water directed to the affected area.  If you are sore or have pain after a cramp or spasm, applying ice to the affected area may relieve discomfort.  Put ice in a plastic bag.  Place a towel between your skin and the bag.  Leave the ice on for 15-20 minutes, 03-04 times a day.  Medicines used to treat a known cause of cramps or spasms may help reduce their frequency or severity. Only take over-the-counter or prescription medicines as directed by your caregiver. SEEK MEDICAL CARE IF:  Your cramps or spasms get more severe, more frequent, or do not improve over time.  MAKE SURE YOU:   Understand these instructions.  Will watch your condition.  Will get help right away if you are not doing well or get worse. Document Released: 07/23/2001 Document Revised: 05/28/2012 Document Reviewed: 01/18/2012 [employer] Patient Information 2015 [place], [place]. This information is not intended to replace advice given to you by your health care provider. Make sure you discuss any questions you have with your health care provider.

## 2014-02-04 NOTE — Progress Notes (Signed)
    MRN: [id number] DOB: 01-Jun-1975  Subjective:   Erin Meadows is a 38 y.o. female presenting for 2 day history of intermittent bilateral leg pain. Started Sunday without any known precipitants, pain is located posteriorly in her thighs/hamstrings, worse with legs hanging over a chair, rated 5/10, non-radiating. Used heat pad, deep "blue oil" like icy hot but without much relief. Now having intermittent mild posterior hip pain bilaterally. Denies difficulty walking, injuries, trauma, surgeries, decreased strength or ROM, decreased sensation, back pain, shooting pain, urinary changes, problems defecating. Patient is concerned about blood clot given that she is on birth control. Denies chest pain, shob, heart racing, lower leg pain, lower leg erythema or edema. Of note, patient had this type of pain ~1 year ago, resolved after ~1 day on its own without any intervention. Patient also admits that she does not drink enough water, has been really busy with her birthday, son's birthday, holidays and preparations for traveling. Denies smoking, occasional alcohol. Denies any other aggravating or relieving factors, no other questions or concerns.  Erin Meadows has a current medication list which includes the following prescription(s): norethindrone-ethinyl estradiol-iron.  She has No Known Allergies.  Denies past medical history. Denies past surgical history.   ROS As in subjective.  Objective:   Vitals: BP 128/80 mmHg  Pulse 87  Temp(Src) 98.1 F (36.7 C) (Oral)  Resp 18  Ht 5\' 4"  (1.626 m)  Wt 152 lb (68.947 kg)  BMI 26.08 kg/m2  SpO2 99%  LMP 01/29/2014  Physical Exam  Constitutional: She is oriented to person, place, and time and well-developed, well-nourished, and in no distress.  Cardiovascular: Normal rate.   Pulmonary/Chest: Effort normal.  Musculoskeletal: Normal range of motion. She exhibits no edema.  Spasms over hamstrings bilaterally. No bony tenderness over hip, back or knee.  Knee and achilles reflex intact. Strength 5/5.  Neurological: She is alert and oriented to person, place, and time.  Skin: Skin is warm and dry. No rash noted. She is not diaphoretic. No erythema.  Psychiatric: Mood and affect normal.   Assessment and Plan :   1. Muscle spasms of both lower extremities - Leg pain likely d/t musculoskeletal etiology, vital signs and physical exam reassuring for r/o dvt, pe, reassured patient - Advised adequate hydration, increased potassium (1/2 banana) daily, stretching - Advised that if she begins to experience chest pain, shob, heart racing, seek immediate medical attention, otherwise return to clinic in 1 week if no improvement in symptoms - Take Flexeril 5-10mg  QHS for muscle spasms  Wallis BambergMario Zairah Arista, PA-C [facility] [phone] 02/04/2014 10:38 AM

## 2014-04-01 ENCOUNTER — Other Ambulatory Visit: Payer: Self-pay | Admitting: Obstetrics and Gynecology

## 2014-04-01 ENCOUNTER — Other Ambulatory Visit (HOSPITAL_COMMUNITY)
Admission: RE | Admit: 2014-04-01 | Discharge: 2014-04-01 | Disposition: A | Discharge status: Home / Self Care | Payer: 59 | Source: Ambulatory Visit | Attending: Obstetrics and Gynecology | Admitting: Obstetrics and Gynecology

## 2014-04-01 DIAGNOSIS — Z01419 Encounter for gynecological examination (general) (routine) without abnormal findings: Secondary | ICD-10-CM | POA: Insufficient documentation

## 2014-04-04 LAB — CYTOLOGY - PAP

## 2014-06-11 ENCOUNTER — Ambulatory Visit (INDEPENDENT_AMBULATORY_CARE_PROVIDER_SITE_OTHER): Payer: 59 | Admitting: Emergency Medicine

## 2014-06-11 VITALS — BP 120/80 | HR 77 | Temp 97.9°F | Resp 16 | Ht 63.5 in | Wt 146.0 lb

## 2014-06-11 DIAGNOSIS — R61 Generalized hyperhidrosis: Secondary | ICD-10-CM

## 2014-06-11 DIAGNOSIS — L74510 Primary focal hyperhidrosis, axilla: Secondary | ICD-10-CM

## 2014-06-11 LAB — TSH: TSH: 1.094 u[IU]/mL (ref 0.350–4.500)

## 2014-06-11 MED ORDER — ALUMINUM CHLORIDE 20 % EX SOLN: Freq: Every day | CUTANEOUS | Status: DC

## 2014-06-11 NOTE — Patient Instructions (Signed)
Diaphoresis Sweating is controlled by our nervous system. Sweat glands are found in the skin throughout the body. They exist in higher numbers in the skin of the hands, feet, armpits, and the genital region. Sweating occurs normally when the temperature of your body goes up. Diaphoresis means profuse sweating or perspiration due to an underlying medical condition or an external factor (such as medicines). Hyperhidrosis means excessive sweating that is not usually due to an underlying medical condition, on areas such as the palms, soles, or armpits. Other areas of the body may also be affected. Hyperhidrosis usually begins in childhood or early adolescence. It increases in severity through puberty and into adulthood. Sweaty palms are the most common problem and the most bothersome to people who have hyperhidrosis. CAUSES  Sweating is normally seen with exercise or being in hot surroundings. Not sweating in these conditions would be harmful. Stressful situations can also cause sweating. In some people, stimulation of the sweat glands during stress is overactive. Talking to strangers or shaking someone's hand can produce profuse sweating. Causes of sweating include:  Emotional upset.  Low blood pressure.  Low blood sugar.  Heart problems.  Low blood cell counts.  Certain pain relieving medicines.  Exercise.  Alcohol.  Infection.  Caffeine.  Spicy foods.  Hot flashes.  Overactive thyroid.  Illegal drug use, such as cocaine and amphetamine.  Use of medicines that stimulate parts of the nervous system.  A tumor (pheochromocytoma).  Withdrawal from some medicines or alcohol. DIAGNOSIS  Your caregiver needs to be consulted to make sure excessive sweating is not caused by another condition. Further testing may need to be done. TREATMENT   When hyperhidrosis is caused by another condition, that condition should be treated.  If menopause is the cause, you may wish to talk to your  caregiver about estrogen replacement.  If the hyperhidrosis is a natural happening of the way your body works, certain antiperspirants may help.  If medicines do not work, injections of botulinum toxin type A are sometimes used for underarm sweating.  Your caregiver can usually help you with this problem. Document Released: 08/23/2004 Document Revised: 04/25/2011 Document Reviewed: 03/10/2008 ExitCare Patient Information 2015 Exit[employer], LLC. This information is not intended to replace advice given to you by your health care provider. Make sure you discuss any questions you have with your health care provider.  

## 2014-06-11 NOTE — Progress Notes (Signed)
    MRN: [id number] DOB: 01/27/1976  Subjective:   Erin Meadows is a 39 y.o. female presenting for chief complaint of Excessive Sweating  Reports several year history of intermittent excessive arm pit sweating. States that anxiety and stress can bring about episodes but also experiences this when she is calm and or resting. She has tried several otc deodorants with minimal relief. Denies heart racing, palpitations during episodes of sweating. She has no sweating of her palms and soles. Also denies history of thyroid d/o, adrenal gland d/o or any other metabolic diagnosis. Denies any other aggravating or relieving factors, no other questions or concerns.  Erin Meadows has a current medication list which includes the following prescription(s): norethindrone-ethinyl estradiol-iron. She has No Known Allergies.  Erin Meadows  has no past medical history on file. Also  has no past surgical history on file.  ROS As in subjective.  Objective:   Vitals: BP 120/80 mmHg  Pulse 77  Temp(Src) 97.9 F (36.6 C) (Oral)  Resp 16  Ht 5' 3.5" (1.613 m)  Wt 146 lb (66.225 kg)  BMI 25.45 kg/m2  SpO2 98%  LMP 05/21/2014  Physical Exam  Constitutional: She is well-developed, well-nourished, and in no distress.  HENT:  Mouth/Throat: Oropharynx is clear and moist.  Neck: Normal range of motion. Neck supple. No thyromegaly (thyroid slightly larger on left side) present.  Cardiovascular: Normal rate, regular rhythm and intact distal pulses.  Exam reveals no gallop and no friction rub.   No murmur heard. Pulmonary/Chest: No respiratory distress. She has no wheezes. She has no rales. She exhibits no tenderness.  Abdominal: Soft. Bowel sounds are normal. She exhibits no distension and no mass. There is no tenderness.  Lymphadenopathy:    She has no cervical adenopathy.  Skin: Skin is warm and dry. No rash noted. No erythema. No pallor.   Assessment and Plan :   1. Excessive sweating 2. Primary focal  hyperhidrosis of axilla - Will start Drysol deodorant, labs pending, will f/u with patient by phone, VM is okay.  Wallis BambergMario Rukaya Kleinschmidt, PA-C [facility] [phone] 06/11/2014 8:47 AM

## 2014-06-12 ENCOUNTER — Encounter: Payer: Self-pay | Admitting: Urgent Care

## 2014-06-17 ENCOUNTER — Telehealth: Payer: Self-pay

## 2014-06-17 NOTE — Telephone Encounter (Signed)
Pt LM inquiring about labs. Let her know a letter was sent and what it said.

## 2014-08-25 ENCOUNTER — Ambulatory Visit (INDEPENDENT_AMBULATORY_CARE_PROVIDER_SITE_OTHER): Payer: 59

## 2014-08-25 ENCOUNTER — Ambulatory Visit (INDEPENDENT_AMBULATORY_CARE_PROVIDER_SITE_OTHER): Payer: 59 | Admitting: Physician Assistant

## 2014-08-25 VITALS — BP 114/80 | HR 82 | Temp 98.1°F | Resp 16 | Ht 64.0 in | Wt 148.6 lb

## 2014-08-25 DIAGNOSIS — M25551 Pain in right hip: Secondary | ICD-10-CM | POA: Diagnosis not present

## 2014-08-25 MED ORDER — TRAMADOL HCL 50 MG PO TABS: 50.0000 mg | ORAL_TABLET | Freq: Three times a day (TID) | ORAL | Status: DC | PRN

## 2014-08-25 MED ORDER — MELOXICAM 15 MG PO TABS: 15.0000 mg | ORAL_TABLET | Freq: Every day | ORAL | Status: DC

## 2014-08-25 NOTE — Patient Instructions (Signed)
Hip Pain Your hip is the joint between your upper legs and your lower pelvis. The bones, cartilage, tendons, and muscles of your hip joint perform a lot of work each day supporting your body weight and allowing you to move around. Hip pain can range from a minor ache to severe pain in one or both of your hips. Pain may be felt on the inside of the hip joint near the groin, or the outside near the buttocks and upper thigh. You may have swelling or stiffness as well.  HOME CARE INSTRUCTIONS   Take medicines only as directed by your health care provider.  Apply ice to the injured area:  Put ice in a plastic bag.  Place a towel between your skin and the bag.  Leave the ice on for 15-20 minutes at a time, 3-4 times a day.  Keep your leg raised (elevated) when possible to lessen swelling.  Avoid activities that cause pain.  Follow specific exercises as directed by your health care provider.  Sleep with a pillow between your legs on your most comfortable side.  Record how often you have hip pain, the location of the pain, and what it feels like. SEEK MEDICAL CARE IF:   You are unable to put weight on your leg.  Your hip is red or swollen or very tender to touch.  Your pain or swelling continues or worsens after 1 week.  You have increasing difficulty walking.  You have a fever. SEEK IMMEDIATE MEDICAL CARE IF:   You have fallen.  You have a sudden increase in pain and swelling in your hip. MAKE SURE YOU:   Understand these instructions.  Will watch your condition.  Will get help right away if you are not doing well or get worse. Document Released: 07/21/2009 Document Revised: 06/17/2013 Document Reviewed: 09/27/2012 ExitCare Patient Information 2015 ExitCare, LLC. This information is not intended to replace advice given to you by your health care provider. Make sure you discuss any questions you have with your health care provider.  

## 2014-08-25 NOTE — Progress Notes (Signed)
   Subjective:    Patient ID: Erin Meadows, female    DOB: 1975-08-12, 39 y.o.   MRN: [id number]  HPI Patient presents for right-sided hip pain that has been present for past 3-4 months. Pain is currently 6/10, constant,  and is worse when sitting. Walking gives some relief. Pain is sometimes felt in upper thigh, but rest of leg unaffected. Denies numbness/tingling of lower extremities, back pain, gait change, or loss of function. Denies trauma, past surgeries, or falls. Denies h/o arthritis. Has taken aleve without any relief. NKDA.    Review of Systems  Constitutional: Negative.  Negative for fever.  Musculoskeletal: Positive for myalgias and arthralgias. Negative for back pain, joint swelling, gait problem and neck stiffness.  Neurological: Negative for weakness and numbness.       Objective:   Physical Exam  Constitutional: She is oriented to person, place, and time. She appears well-developed and well-nourished. No distress.  Blood pressure 114/80, pulse 82, temperature 98.1 F (36.7 C), temperature source Oral, resp. rate 16, height 5\' 4"  (1.626 m), weight 148 lb 9.6 oz (67.405 kg), last menstrual period 08/11/2014, SpO2 99 %.  HENT:  Head: Normocephalic and atraumatic.  Right Ear: External ear normal.  Left Ear: External ear normal.  Eyes: Conjunctivae and EOM are normal. Pupils are equal, round, and reactive to light. Right eye exhibits no discharge. Left eye exhibits no discharge. No scleral icterus.  Neck: Normal range of motion. Neck supple.  Pulmonary/Chest: Effort normal.  Musculoskeletal: Normal range of motion. She exhibits tenderness. She exhibits no edema.       Right hip: She exhibits tenderness. She exhibits normal range of motion, normal strength, no bony tenderness, no swelling, no crepitus and no deformity.       Left hip: Normal.       Right knee: Normal.       Thoracic back: Normal.       Lumbar back: Normal.       Right upper leg: Normal.       Right foot:  Normal.  Increase pain with internal and external rotation.  Neurological: She is alert and oriented to person, place, and time. She has normal strength and normal reflexes. She displays no atrophy. No cranial nerve deficit or sensory deficit. She exhibits normal muscle tone. Coordination normal.  Skin: Skin is warm and dry. No rash noted. She is not diaphoretic. No erythema. No pallor.  Psychiatric: She has a normal mood and affect. Her behavior is normal. Judgment and thought content normal.   [facility] reading (PRIMARY) by  Dr. Neva SeatGreene. Possible subchondral cyst secondary to mild degeneration. No fracture present.    Assessment & Plan:  1. Hip pain, right - DG HIP UNILAT WITH PELVIS 2-3 VIEWS RIGHT; Future - meloxicam (MOBIC) 15 MG tablet; Take 1 tablet (15 mg total) by mouth daily.  Dispense: 30 tablet; Refill: 1 - traMADol (ULTRAM) 50 MG tablet; Take 1 tablet (50 mg total) by mouth every 8 (eight) hours as needed.  Dispense: 30 tablet; Refill: 0 - AMB referral to orthopedics   Janan Ridgeishira Georgi Navarrete PA-C  [facility] 08/25/2014 11:29 AM

## 2014-08-28 ENCOUNTER — Telehealth: Payer: Self-pay

## 2014-08-28 NOTE — Telephone Encounter (Signed)
Request printed and forwarded to x-ray. °

## 2014-08-28 NOTE — Telephone Encounter (Signed)
Patient called at 8:33am requesting x-rays on a disc for her upcoming ortho appt on Monday. I told her she has to sign release form when she comes to pick up disc. Cb# [phone]. Will forward to x-ray.

## 2015-03-01 ENCOUNTER — Ambulatory Visit (INDEPENDENT_AMBULATORY_CARE_PROVIDER_SITE_OTHER): Payer: 59 | Admitting: Physician Assistant

## 2015-03-01 VITALS — BP 130/62 | HR 97 | Temp 98.1°F | Resp 14 | Ht 64.0 in | Wt 151.8 lb

## 2015-03-01 DIAGNOSIS — J029 Acute pharyngitis, unspecified: Secondary | ICD-10-CM | POA: Diagnosis not present

## 2015-03-01 LAB — POCT RAPID STREP A (OFFICE): Rapid Strep A Screen: POSITIVE — AB

## 2015-03-01 MED ORDER — AMOXICILLIN 875 MG PO TABS: 875.0000 mg | ORAL_TABLET | Freq: Two times a day (BID) | ORAL | Status: DC

## 2015-03-01 NOTE — Patient Instructions (Signed)
-   You have a viral upper respiratory infection, (the common cold) and it is not uncommon for symptoms to last 10 days to 2 weeks, regardless of which medications you take. Unfortunately antibiotics will not help your symptoms as they target bacteria, and you are likely suffering from a virus. Additionally, 1 in 8 people who take antibiotics suffer mild to severe side effects.    - You would benefit from high dose ibuprofen. TAKE 600-800 mg of ibuprofen every 8 hours as needed to control low grade fever, fatigue, and pain.    - I also recommend that you take Zyrtec-D 5-120 every morning or Claritin-D 10-240 for the next five to 10 days. This medication will help you with nasal congestion, post nasal drip and sneezing. You will have to purchase this directly from the pharmacist   Viral URI Medications: Please consult the pharmacist if you have questions. 600-800 mg ibuprofen every 8 hours as needed for the next five to ten days 5-120 Zyrtec-D every morning for five to 10 days OR Claritin D 10-240 every morning for five days.  Please visit the CDC's website https://www.cdc.gov/getsmart/ to learn about appropriate and inappropriate uses of antibiotics.    

## 2015-03-01 NOTE — Progress Notes (Signed)
   03/01/2015 11:36 AM   DOB: Dec 24, 1975 / MRN: [id number]  SUBJECTIVE:  Erin Meadows is a 40 y.o. female presenting for sore throat that starte 4 days ago.  Associates left ear pain on the inside.  Denies nasal congestion, cough, fever, chills, appetite changes, and urination changes.  She has tried Cold Ez and Aleve with mild relief.  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     She has No Known Allergies.   She  has no past medical history on file.    She  reports that she has never smoked. She does not have any smokeless tobacco history on file. She  has no sexual activity history on file. The patient  has no past surgical history on file.  Her family history includes Heart attack in her father; Hypertension in her brother and father.  Review of Systems  Constitutional: Positive for malaise/fatigue. Negative for fever, chills and diaphoresis.  HENT: Positive for sore throat. Negative for congestion.   Respiratory: Negative for cough, hemoptysis, shortness of breath and wheezing.   Cardiovascular: Negative for chest pain.  Gastrointestinal: Negative for nausea.  Skin: Negative for rash.  Neurological: Negative for dizziness and weakness.  Endo/Heme/Allergies: Negative for polydipsia.    Problem list and medications reviewed and updated by myself where necessary, and exist elsewhere in the encounter.   OBJECTIVE:  BP 130/62 mmHg  Pulse 97  Temp(Src) 98.1 F (36.7 C) (Oral)  Resp 14  Ht 5\' 4"  (1.626 m)  Wt 151 lb 12.8 oz (68.856 kg)  BMI 26.04 kg/m2  SpO2 95%  Physical Exam  Constitutional: She is oriented to person, place, and time. She appears well-developed.  HENT:  Mouth/Throat: Uvula is midline and mucous membranes are normal. Posterior oropharyngeal erythema present. No oropharyngeal exudate, posterior oropharyngeal edema or tonsillar abscesses.  Eyes: EOM are normal. Pupils are equal, round, and reactive to light.  Cardiovascular: Normal rate.   Pulmonary/Chest: Effort normal.   Abdominal: She exhibits no distension.  Musculoskeletal: Normal range of motion.  Lymphadenopathy:       Head (right side): No tonsillar adenopathy present.    She has no cervical adenopathy.  Neurological: She is alert and oriented to person, place, and time. No cranial nerve deficit.  Skin: Skin is warm and dry. She is not diaphoretic.  Psychiatric: She has a normal mood and affect.  Vitals reviewed.   No results found for this or any previous visit (from the past 48 hour(s)).  ASSESSMENT AND PLAN  Erin Meadows was seen today for ear pain and sore throat.  Diagnoses and all orders for this visit:  Sore throat: Rapid positive.  Amox 875 bid for ten days..  She is not coughing.  OTC medications recommended via AVS.  Advised she push fluids.  -     POCT rapid strep A -     Culture, Group A Strep   The patient was advised to call or return to clinic if she does not see an improvement in symptoms or to seek the care of the closest emergency department if she worsens with the above plan.   Deliah BostonMichael Clark, MHS, PA-C [facility] 03/01/2015 11:36 AM

## 2015-03-02 NOTE — Progress Notes (Signed)
  Medical screening examination/treatment/procedure(s) were performed by non-physician practitioner and as supervising physician I was immediately available for consultation/collaboration.     

## 2015-03-02 NOTE — Addendum Note (Signed)
Addended by: Carmelina DaneANDERSON, Takeisha Cianci S on: 03/02/2015 05:12 PM   Modules accepted: Kipp BroodSmartSet

## 2015-03-21 ENCOUNTER — Ambulatory Visit (INDEPENDENT_AMBULATORY_CARE_PROVIDER_SITE_OTHER): Payer: 59 | Admitting: Physician Assistant

## 2015-03-21 VITALS — BP 120/70 | HR 82 | Temp 98.1°F | Resp 16 | Ht 64.0 in | Wt 151.0 lb

## 2015-03-21 DIAGNOSIS — J029 Acute pharyngitis, unspecified: Secondary | ICD-10-CM

## 2015-03-21 LAB — POCT RAPID STREP A (OFFICE): RAPID STREP A SCREEN: NEGATIVE

## 2015-03-21 MED ORDER — AMOXICILLIN-POT CLAVULANATE 875-125 MG PO TABS: 1.0000 | ORAL_TABLET | Freq: Two times a day (BID) | ORAL | Status: DC

## 2015-03-21 NOTE — Progress Notes (Signed)
03/21/2015 9:44 AM   DOB: 1975-11-28 / MRN: [id number]  SUBJECTIVE:  Erin Meadows is a 40 y.o. female presenting for a recurrence of sore throat.  She was treated roughly 1 months ago by me with Amox for similar symptoms and a positive rapid strep. Today she feels her symptoms are very similar.  She denies cough, reports that she feels feverish.    She has No Known Allergies.   She  has no past medical history on file.    She  reports that she has never smoked. She does not have any smokeless tobacco history on file. She  has no sexual activity history on file. The patient  has no past surgical history on file.  Her family history includes Heart attack in her father; Hypertension in her brother and father.  Review of Systems  Constitutional: Negative for fever and chills.  Eyes: Negative for blurred vision.  Respiratory: Negative for cough and shortness of breath.   Cardiovascular: Negative for chest pain.  Gastrointestinal: Negative for nausea and abdominal pain.  Genitourinary: Negative for dysuria, urgency and frequency.  Musculoskeletal: Negative for myalgias.  Skin: Negative for rash.  Neurological: Negative for dizziness, tingling and headaches.  Psychiatric/Behavioral: Negative for depression. The patient is not nervous/anxious.     Problem list and medications reviewed and updated by myself where necessary, and exist elsewhere in the encounter.   OBJECTIVE:  BP 120/70 mmHg  Pulse 82  Temp(Src) 98.1 F (36.7 C) (Oral)  Resp 16  Ht  (1.626 m)  Wt 151 lb (68.493 kg)  BMI 25.91 kg/m2  SpO2 98%  Physical Exam  Constitutional: She is oriented to person, place, and time.  HENT:  Right Ear: External ear normal.  Left Ear: External ear normal.  Nose: Mucosal edema present. Right sinus exhibits no maxillary sinus tenderness and no frontal sinus tenderness. Left sinus exhibits no maxillary sinus tenderness and no frontal sinus tenderness.  Mouth/Throat: Oropharynx  is clear and moist. No oropharyngeal exudate.  Eyes: Conjunctivae are normal. Pupils are equal, round, and reactive to light.  Cardiovascular: Regular rhythm and normal heart sounds.   Pulmonary/Chest: Effort normal and breath sounds normal. She has no rales.  Neurological: She is alert and oriented to person, place, and time.  Skin: Skin is warm and dry. No rash noted. She is not diaphoretic. No erythema.  Psychiatric: Her behavior is normal.    Results for orders placed or performed in visit on 03/21/15 (from the past 72 hour(s))  POCT rapid strep A     Status: None   Collection Time: 03/21/15  8:53 AM  Result Value Ref Range   Rapid Strep A Screen Negative Negative    No results found.  ASSESSMENT AND PLAN  Erin Meadows was seen today for sore throat and ear pain.  Diagnoses and all orders for this visit:  Sore throat: Appears to be a recurrent infection. Will treat her empirically for now and await culture results.   -     POCT rapid strep A -     Culture, Group A Strep -     amoxicillin-clavulanate (AUGMENTIN) 875-125 MG tablet; Take 1 tablet by mouth 2 (two) times daily.    The patient was advised to call or return to clinic if she does not see an improvement in symptoms or to seek the care of the closest emergency department if she worsens with the above plan.   Deliah Boston, MHS, PA-C [facility] 03/21/2015 9:44 AM

## 2015-03-22 LAB — CULTURE, GROUP A STREP: ORGANISM ID, BACTERIA: NORMAL

## 2015-03-24 ENCOUNTER — Telehealth: Payer: Self-pay

## 2015-03-24 NOTE — Telephone Encounter (Signed)
The patient called to ask if she still needed to take the antibiotics that Deliah Boston prescribed, since her latest rapid strep and culture were negative.  Please advise, thank you.  CB#: [phone]

## 2015-03-26 NOTE — Telephone Encounter (Signed)
She can stop the antibiotic since the culture was negative.

## 2015-03-31 NOTE — Telephone Encounter (Signed)
lmom informing of below.

## 2015-04-06 ENCOUNTER — Other Ambulatory Visit (HOSPITAL_COMMUNITY)
Admission: RE | Admit: 2015-04-06 | Discharge: 2015-04-06 | Disposition: A | Discharge status: Home / Self Care | Payer: 59 | Source: Ambulatory Visit | Attending: Obstetrics and Gynecology | Admitting: Obstetrics and Gynecology

## 2015-04-06 ENCOUNTER — Other Ambulatory Visit: Payer: Self-pay | Admitting: Obstetrics and Gynecology

## 2015-04-06 DIAGNOSIS — Z01411 Encounter for gynecological examination (general) (routine) with abnormal findings: Secondary | ICD-10-CM | POA: Diagnosis present

## 2015-04-06 DIAGNOSIS — Z1151 Encounter for screening for human papillomavirus (HPV): Secondary | ICD-10-CM | POA: Diagnosis not present

## 2015-04-06 DIAGNOSIS — R87810 Cervical high risk human papillomavirus (HPV) DNA test positive: Secondary | ICD-10-CM | POA: Diagnosis present

## 2015-04-08 LAB — CYTOLOGY - PAP

## 2015-04-17 ENCOUNTER — Ambulatory Visit (INDEPENDENT_AMBULATORY_CARE_PROVIDER_SITE_OTHER): Payer: 59 | Admitting: Physician Assistant

## 2015-04-17 VITALS — BP 124/86 | HR 92 | Temp 97.8°F | Resp 16 | Ht 64.0 in | Wt 150.0 lb

## 2015-04-17 DIAGNOSIS — H6983 Other specified disorders of Eustachian tube, bilateral: Secondary | ICD-10-CM

## 2015-04-17 MED ORDER — PSEUDOEPHEDRINE HCL ER 120 MG PO TB12: 120.0000 mg | ORAL_TABLET | Freq: Two times a day (BID) | ORAL | Status: DC

## 2015-04-17 NOTE — Progress Notes (Signed)
[facility] [address], [place] [address] [phone]  Date:  04/17/2015   Name:  Erin HightKimberly Meadows   DOB:  [birth year]   MRN:  [id number]  PCP:  No PCP Per Patient    Chief Complaint: Ear Pain   History of Present Illness:  This is a 40 y.o. female who is presenting with left ear fullness x 6 weeks. States she was here 6 weeks ago with sore throat and diagnosed with strep. Took 10 days of amoxicillin. Return 2 weeks later with recurrence of sore throat and left ear fullness. Was again give rx of amoxicillin. Rapid strep negative, however she decided to finish the abx since for her ear. No change in ear symptoms. I spoke with her on the phone 1 week ago and advised she take some sudafed. She states she has been taking for 5 days -- helps some, but recurs at night. Not much pain, just fullness. No hearing loss. No other uri sx. She has been using flonase daily. Chart review shows she has had ETD before, although pt does not recall this  Review of Systems:  Review of Systems See HPI  There are no active problems to display for this patient.   Prior to Admission medications   Medication Sig Start Date End Date Taking? Authorizing Provider  norethindrone-ethinyl estradiol-iron (ESTROSTEP FE,TILIA FE,TRI-LEGEST FE) 1-20/1-30/1-35 MG-MCG tablet Take 1 tablet by mouth daily.   Yes Historical Provider, MD  aluminum chloride (DRYSOL) 20 % external solution Apply topically at bedtime. Patient not taking: Reported on 08/25/2014 06/11/14   Wallis BambergMario Mani, PA-C    No Known Allergies  History reviewed. No pertinent past surgical history.  Social History  Substance Use Topics  . Smoking status: Never Smoker   . Smokeless tobacco: None  . Alcohol Use: None    Family History  Problem Relation Age of Onset  . Hypertension Father   . Heart attack Father   . Hypertension Brother     Medication list has been reviewed and updated.  Physical Examination:  Physical Exam   Constitutional: She is oriented to person, place, and time. She appears well-developed and well-nourished. No distress.  HENT:  Head: Normocephalic and atraumatic.  Right Ear: Hearing, external ear and ear canal normal.  Left Ear: Hearing, external ear and ear canal normal.  Nose: Nose normal.  Mouth/Throat: Uvula is midline, oropharynx is clear and moist and mucous membranes are normal.  Bilateral TMs cloudy, middle ear effusions, not erythematous  Eyes: Conjunctivae and lids are normal. Right eye exhibits no discharge. Left eye exhibits no discharge. No scleral icterus.  Cardiovascular: Normal rate, regular rhythm, normal heart sounds and normal pulses.   No murmur heard. Pulmonary/Chest: Effort normal and breath sounds normal. No respiratory distress. She has no wheezes. She has no rhonchi. She has no rales.  Musculoskeletal: Normal range of motion.  Lymphadenopathy:       Head (right side): No submental, no submandibular and no tonsillar adenopathy present.       Head (left side): No submental, no submandibular and no tonsillar adenopathy present.    She has no cervical adenopathy.  Neurological: She is alert and oriented to person, place, and time.  Skin: Skin is warm, dry and intact. No lesion and no rash noted.  Psychiatric: She has a normal mood and affect. Her speech is normal and behavior is normal. Thought content normal.   BP 124/86 mmHg  Pulse 92  Temp(Src) 97.8 F (36.6  C)  Resp 16  Ht  (1.626 m)  Wt 150 lb (68.04 kg)  BMI 25.73 kg/m2  SpO2 99%  LMP 03/27/2015  Assessment and Plan:  1. ETD (eustachian tube dysfunction), bilateral Advised taking sudafed QAM, flonase QD, zyrtec QHS x 2 weeks. If not getting better in 2 weeks, call and I will refer to ENT. - pseudoephedrine (SUDAFED 12 HOUR) 120 MG 12 hr tablet; Take 1 tablet (120 mg total) by mouth 2 (two) times daily.  Dispense: 30 tablet; Refill: 0   Roswell Miners. Dyke Brackett, MHS [facility]  04/17/2015

## 2015-04-17 NOTE — Patient Instructions (Signed)
Take 12 hour sudafed once a day in the morning. Use flonase two sprays in each nostril once a day Take 10 mg zyrtec at night. Do this for 2 weeks, if not getting better, let me know and I will refer you to an ENT

## 2015-05-04 ENCOUNTER — Other Ambulatory Visit: Payer: Self-pay | Admitting: Obstetrics and Gynecology

## 2015-05-22 ENCOUNTER — Encounter (HOSPITAL_COMMUNITY): Payer: Self-pay | Admitting: *Deleted

## 2015-05-27 NOTE — Anesthesia Preprocedure Evaluation (Addendum)
Anesthesia Evaluation  Patient identified by MRN, date of birth, ID band Patient awake    Reviewed: Allergy & Precautions, NPO status , Patient's Chart, lab work & pertinent test results  Airway Mallampati: II       Dental  (+) Dental Advisory Given   Pulmonary neg pulmonary ROS,    breath sounds clear to auscultation       Cardiovascular hypertension (mild, no meds), negative cardio ROS   Rhythm:Regular     Neuro/Psych negative neurological ROS  negative psych ROS   GI/Hepatic negative GI ROS, Neg liver ROS,   Endo/Other  negative endocrine ROS  Renal/GU negative Renal ROS  negative genitourinary   Musculoskeletal negative musculoskeletal ROS (+)   Abdominal   Peds negative pediatric ROS (+)  Hematology negative hematology ROS (+)   Anesthesia Other Findings PG neg  Reproductive/Obstetrics negative OB ROS                           Anesthesia Physical Anesthesia Plan  ASA: I  Anesthesia Plan: General   Post-op Pain Management:    Induction: Intravenous  Airway Management Planned: LMA  Additional Equipment:   Intra-op Plan:   Post-operative Plan:   Informed Consent: I have reviewed the patients History and Physical, chart, labs and discussed the procedure including the risks, benefits and alternatives for the proposed anesthesia with the patient or authorized representative who has indicated his/her understanding and acceptance.     Plan Discussed with:   Anesthesia Plan Comments:         Anesthesia Quick Evaluation

## 2015-06-02 ENCOUNTER — Other Ambulatory Visit (HOSPITAL_COMMUNITY): Payer: Self-pay | Admitting: Obstetrics and Gynecology

## 2015-06-02 NOTE — H&P (Signed)
Chief Complaint(s):   f/u colpo/ HISTORY and PHYSICAL for [facility]   HPI:  General 40 y/o presents to discuss colposcopy results performed on 05/04/2015. She had a HGSIL pap smear 04/06/2015 with high risk hpv detected.  Pathology from her colpo shows High grade dysplasia at 12 oclock that is thought to be at least CIN II. ECC shows detached fragments of dysplastic squamous mucosa. concerning for a high grade lesion.  Current Medication:  Taking  Nystatin-Triamcinolone 100000-0.1 UNIT/GM-% Cream 1 application to affected area Externally Twice a day prn     Lutera(Levonorgestrel-Ethinyl Estrad) 0.1-20 MG-MCG Tablet 1 tablet Orally Daily for Three Weeks, 1 Week off     Flonase(Fluticasone Propionate) 50 MCG/ACT Suspension 1 spray in each nostril Nasally Once a day     Medication List reviewed and reconciled with the patient   Medical History:   seasonal allergies      Allergies/Intolerance:   N.K.D.A.   Gyn History:   Sexual activity currently sexually active. Periods : regular. LMP 05/20/15. Birth control Lutera to help with painful periods and for contraception as well.. Last pap smear date 04/06/15. Last mammogram date N/A. H/O Abnormal pap smear LGSIL, CIN I. Denies STD. Menarche [age] years old.   OB History:   Number of pregnancies 1. Pregnancy # 1 02/03/09, live birth, vaginal delivery, boy "GAVIN".   Surgical History:   No Surgical History documented.   Hospitalization:   childbirth x 1   Family History:   Father: alive, hypertension    Mother: alive    2 brother(s) . 1 son(s) .    denies family h/o gyn cancers.  Social History:  General Tobacco use cigarettes: Never smoked, Tobacco history last updated 05/21/2015.  no EXPOSURE TO PASSIVE SMOKE.  Alcohol: yes, social.  Caffeine: yes, 2 servings daily, coffee.  no Recreational drug use.  DIET: yes, regular.  Exercise: yes, 2-3 x weekly, walks.  Marital Status: single, Divorced.  Children: 1, son (s).  EDUCATION: yes,  [employer] Grad.  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  Seat belt use: yes.  ROS: CONSTITUTIONAL none" options="no,yes" propid="91" itemid="[id number]" categoryid="10464" encounterid="[id number]"Fatigue none. none today" options="no,yes" propid="91" itemid="10467" categoryid="10464" encounterid="[id number]"Fever none today.  CARDIOLOGY none" options="no,yes" propid="91" itemid="[id number]" categoryid="10488" encounterid="[id number]"Chest pain none.  RESPIRATORY no" options="no" propid="91" itemid="270013" categoryid="138132" encounterid="[id number]"Shortness of breath no. no" options="no,yes" propid="91" itemid="172745" categoryid="138132" encounterid="[id number]"Cough no.  GASTROENTEROLOGY none" options="no,yes" propid="91" itemid="193447" categoryid="10494" encounterid="[id number]"Appetite change none. no" options="no,yes" propid="91" itemid="193449" categoryid="10494" encounterid="[id number]"Change in bowel habits no.  FEMALE REPRODUCTIVE no" options="no,yes" propid="91" itemid="196298" categoryid="10525" encounterid="[id number]"Breast lumps or discharge no. none" options="no,yes" propid="91" itemid="186083" categoryid="10525" encounterid="[id number]"Breast pain none. none" options="no,yes" propid="91" itemid="138198" categoryid="10525" encounterid="[id number]"Dyspareunia none. no" options="no,yes" propid="91" itemid="202654" categoryid="10525" encounterid="[id number]"Dysuria no. none" options="no,yes" propid="91" itemid="186082" categoryid="10525" encounterid="[id number]"Pelvic pain none. yes" options="no,yes" propid="91" itemid="199173" categoryid="10525" encounterid="[id number]"Regular menses yes. no" options="no,yes" propid="91" itemid="278230" categoryid="10525" encounterid="[id number]"Unusual vaginal discharge no. no" options="no,yes" propid="91" itemid="278942" categoryid="10525" encounterid="[id number]"Vaginal itching no. no" options="no,yes" propid="91" itemid="278837" categoryid="10525" encounterid="[id number]"Vulvar/labial lesion  no.  NEUROLOGY none" options="no,yes" propid="91" itemid="193627" categoryid="12512" encounterid="[id number]"Migraines none. none" options="no,yes" propid="91" itemid="12514" categoryid="12512" encounterid="[id number]"Tingling/numbness none. none" options="no,yes" propid="91" itemid="193467" categoryid="12512" encounterid="[id number]"Visual changes none.  PSYCHOLOGY no" options="" propid="91" itemid="275919" categoryid="10520" encounterid="[id number]"Depression no.  SKIN no" options="no,yes" propid="91" itemid="269383" categoryid="202750" encounterid="[id number]"Rash no. no" options="no,yes" propid="91" itemid="202757" categoryid="202750" encounterid="[id number]"Suspicious lesions no.  ENDOCRINOLOGY none" options="no,yes" propid="91" itemid="202624" categoryid="12508" encounterid="[id number]"Hot flashes none. no unintentional" options="no,yes" propid="91" itemid="193436" categoryid="12508" encounterid="[id number]"Weight gain no unintentional. none" options="no,yes" propid="91" itemid="138164" categoryid="12508" encounterid="[id number]"Weight loss none.  HEMATOLOGY/LYMPH no" options="no,yes" propid="91" itemid="193454" categoryid="138157" encounterid="[id number]"Anemia no.    Objective: Vitals:  Wt 147, Wt change -2 lb, Pulse  sitting 82, BP sitting 120/72  Past Results:  Examination:  Physical Examination: GENERAL in NAD, pleasant"Patient appears in NAD, pleasant. well developed"Build: well developed. well-appearing"General Appearance: well-appearing. caucasian"Race: caucasian.  LUNGS clear to auscultation"Breath sounds: clear to auscultation. no"Dyspnea: no.  HEART none"Murmurs: none. normal"Rate: normal. regular"Rhythm: regular.  ABDOMEN no masses,tenderness,organomegaly, no CVAT"General: no masses,tenderness,organomegaly, no CVAT.  FEMALE GENITOURINARY no mass, non tender"Adnexa: no mass, non tender. normal, no lesions"Anus/perineum: normal, no lesions. normal appearance "Cervix/ cuff: normal appearance . normal, no  lesions, no skin discoloration, no lymphadenopathy"External genitalia: normal, no lesions, no skin discoloration, no lymphadenopathy. normal external meatus"Urethra: normal external meatus. normal size/shape/consistency, freely mobile, non tender"Uterus: normal size/shape/consistency, freely mobile, non tender. deferred"Rectum: deferred. pink/moist mucosa, no lesions, no abnormal discharge, odorless"Vagina: pink/moist mucosa, no lesions, no abnormal discharge, odorless. normal, no lesions, no skin discoloration, non tender"Vulva: normal, no lesions, no skin discoloration, non tender.  EXTREMITIES FROM of all extremities"Extremities FROM of all extremities.  NEUROLOGICAL normal"Gait: normal. alert and oriented x 3"Orientation: alert and oriented x 3.    Assessment: Assessment:  CIN II (cervical intraepithelial neoplasia II) - N87.1 (Primary)     Plan: Treatment:  CIN II (cervical intraepithelial neoplasia II)  Notes: recommend Cold knife conization as pt is not interested in furture fertility and dysplastic cells were seen on ECC.Marland Kitchen discussed with patient R/B/A of conization including but not limited to infection. bleeding need for further surgery.. pt voiced understanding and desires to proceed.

## 2015-06-03 ENCOUNTER — Ambulatory Visit (HOSPITAL_COMMUNITY): Payer: 59 | Admitting: Anesthesiology

## 2015-06-03 ENCOUNTER — Ambulatory Visit (HOSPITAL_COMMUNITY)
Admission: RE | Admit: 2015-06-03 | Discharge: 2015-06-03 | Disposition: A | Discharge status: Home / Self Care | Payer: 59 | Source: Ambulatory Visit | Attending: Obstetrics and Gynecology | Admitting: Obstetrics and Gynecology

## 2015-06-03 ENCOUNTER — Encounter (HOSPITAL_COMMUNITY): Payer: Self-pay | Admitting: *Deleted

## 2015-06-03 ENCOUNTER — Encounter (HOSPITAL_COMMUNITY)
Admission: RE | Disposition: A | Discharge status: Home / Self Care | Payer: Self-pay | Source: Ambulatory Visit | Attending: Obstetrics and Gynecology

## 2015-06-03 DIAGNOSIS — N871 Moderate cervical dysplasia: Secondary | ICD-10-CM | POA: Insufficient documentation

## 2015-06-03 DIAGNOSIS — I10 Essential (primary) hypertension: Secondary | ICD-10-CM | POA: Diagnosis not present

## 2015-06-03 HISTORY — DX: Essential (primary) hypertension: I10

## 2015-06-03 HISTORY — DX: Other seasonal allergic rhinitis: J30.2

## 2015-06-03 HISTORY — PX: CERVICAL CONIZATION W/BX: SHX1330

## 2015-06-03 LAB — CBC
HCT: 44.5 % (ref 36.0–46.0)
HEMOGLOBIN: 15.3 g/dL — AB (ref 12.0–15.0)
MCH: 31 pg (ref 26.0–34.0)
MCHC: 34.4 g/dL (ref 30.0–36.0)
MCV: 90.3 fL (ref 78.0–100.0)
PLATELETS: 321 10*3/uL (ref 150–400)
RBC: 4.93 MIL/uL (ref 3.87–5.11)
RDW: 13.1 % (ref 11.5–15.5)
WBC: 8.2 10*3/uL (ref 4.0–10.5)

## 2015-06-03 LAB — HCG, SERUM, QUALITATIVE: PREG SERUM: NEGATIVE

## 2015-06-03 SURGERY — CONE BIOPSY, CERVIX
Anesthesia: General | Site: Cervix

## 2015-06-03 MED ORDER — IODINE STRONG (LUGOLS) 5 % PO SOLN
ORAL | Status: DC | PRN
Start: 2015-06-03 — End: 2015-06-03
  Administered 2015-06-03: 0.1 mL

## 2015-06-03 MED ORDER — DEXAMETHASONE SODIUM PHOSPHATE 4 MG/ML IJ SOLN
INTRAMUSCULAR | Status: AC
  Filled 2015-06-03: qty 1

## 2015-06-03 MED ORDER — KETOROLAC TROMETHAMINE 30 MG/ML IJ SOLN
INTRAMUSCULAR | Status: AC
  Filled 2015-06-03: qty 1

## 2015-06-03 MED ORDER — FERRIC SUBSULFATE SOLN
Status: DC | PRN
  Administered 2015-06-03: 2

## 2015-06-03 MED ORDER — OXYCODONE-ACETAMINOPHEN 5-325 MG PO TABS: 1.0000 | ORAL_TABLET | ORAL | Status: DC | PRN

## 2015-06-03 MED ORDER — ONDANSETRON HCL 4 MG/2ML IJ SOLN
INTRAMUSCULAR | Status: AC
  Filled 2015-06-03: qty 2

## 2015-06-03 MED ORDER — FENTANYL CITRATE (PF) 100 MCG/2ML IJ SOLN
INTRAMUSCULAR | Status: AC
  Filled 2015-06-03: qty 2

## 2015-06-03 MED ORDER — SCOPOLAMINE 1 MG/3DAYS TD PT72: 1.0000 | MEDICATED_PATCH | Freq: Once | TRANSDERMAL | Status: DC

## 2015-06-03 MED ORDER — MIDAZOLAM HCL 2 MG/2ML IJ SOLN
INTRAMUSCULAR | Status: DC | PRN
  Administered 2015-06-03: 2 mg via INTRAVENOUS

## 2015-06-03 MED ORDER — FERRIC SUBSULFATE 259 MG/GM EX SOLN
CUTANEOUS | Status: AC
  Filled 2015-06-03: qty 16

## 2015-06-03 MED ORDER — DEXAMETHASONE SODIUM PHOSPHATE 10 MG/ML IJ SOLN
INTRAMUSCULAR | Status: DC | PRN
  Administered 2015-06-03: 4 mg via INTRAVENOUS

## 2015-06-03 MED ORDER — IBUPROFEN 600 MG PO TABS: ORAL_TABLET | ORAL | Status: DC

## 2015-06-03 MED ORDER — PROPOFOL 10 MG/ML IV BOLUS
INTRAVENOUS | Status: DC | PRN
  Administered 2015-06-03: 180 mg via INTRAVENOUS

## 2015-06-03 MED ORDER — FERRIC SUBSULFATE 259 MG/GM EX SOLN
CUTANEOUS | Status: AC
  Filled 2015-06-03: qty 8

## 2015-06-03 MED ORDER — LIDOCAINE-EPINEPHRINE 1 %-1:100000 IJ SOLN
INTRAMUSCULAR | Status: AC
  Filled 2015-06-03: qty 1

## 2015-06-03 MED ORDER — ONDANSETRON HCL 4 MG/2ML IJ SOLN
INTRAMUSCULAR | Status: DC | PRN
  Administered 2015-06-03: 4 mg via INTRAVENOUS

## 2015-06-03 MED ORDER — PROPOFOL 10 MG/ML IV BOLUS
INTRAVENOUS | Status: AC
  Filled 2015-06-03: qty 20

## 2015-06-03 MED ORDER — LIDOCAINE HCL (CARDIAC) 20 MG/ML IV SOLN
INTRAVENOUS | Status: DC | PRN
  Administered 2015-06-03: 50 mg via INTRAVENOUS

## 2015-06-03 MED ORDER — ACETIC ACID 5 % SOLN
Status: AC
  Filled 2015-06-03: qty 500

## 2015-06-03 MED ORDER — IODINE STRONG (LUGOLS) 5 % PO SOLN
ORAL | Status: AC
  Filled 2015-06-03: qty 1

## 2015-06-03 MED ORDER — MIDAZOLAM HCL 2 MG/2ML IJ SOLN
INTRAMUSCULAR | Status: AC
  Filled 2015-06-03: qty 2

## 2015-06-03 MED ORDER — FENTANYL CITRATE (PF) 100 MCG/2ML IJ SOLN
INTRAMUSCULAR | Status: DC | PRN
  Administered 2015-06-03: 25 ug via INTRAVENOUS
  Administered 2015-06-03: 50 ug via INTRAVENOUS
  Administered 2015-06-03: 25 ug via INTRAVENOUS

## 2015-06-03 MED ORDER — LACTATED RINGERS IV SOLN
INTRAVENOUS | Status: DC
  Administered 2015-06-03 (×2): via INTRAVENOUS

## 2015-06-03 SURGICAL SUPPLY — 28 items
BLADE SURG 11 STRL SS (BLADE) ×2 IMPLANT
CATH ROBINSON RED A/P 16FR (CATHETERS) ×2 IMPLANT
CLOTH BEACON ORANGE TIMEOUT ST (SAFETY) ×2 IMPLANT
CONTAINER PREFILL 10% NBF 60ML (FORM) ×2 IMPLANT
COUNTER NEEDLE 1200 MAGNETIC (NEEDLE) ×2 IMPLANT
ELECT REM PT RETURN 9FT ADLT (ELECTROSURGICAL) ×2
ELECTRODE REM PT RTRN 9FT ADLT (ELECTROSURGICAL) ×1 IMPLANT
GLOVE BIOGEL M 6.5 STRL (GLOVE) ×5 IMPLANT
GLOVE BIOGEL PI IND STRL 6.5 (GLOVE) ×1 IMPLANT
GLOVE BIOGEL PI IND STRL 7.0 (GLOVE) ×1 IMPLANT
GLOVE BIOGEL PI INDICATOR 6.5 (GLOVE) ×1
GLOVE BIOGEL PI INDICATOR 7.0 (GLOVE) ×1
GOWN STRL REUS W/TWL LRG LVL3 (GOWN DISPOSABLE) ×4 IMPLANT
HEMOSTAT SURGICEL 2X14 (HEMOSTASIS) ×1 IMPLANT
HEMOSTAT SURGICEL 2X3 (HEMOSTASIS) IMPLANT
NS IRRIG 1000ML POUR BTL (IV SOLUTION) ×1 IMPLANT
PACK VAGINAL MINOR WOMEN LF (CUSTOM PROCEDURE TRAY) ×2 IMPLANT
PAD OB MATERNITY 4.3X12.25 (PERSONAL CARE ITEMS) ×2 IMPLANT
PENCIL BUTTON HOLSTER BLD 10FT (ELECTRODE) ×2 IMPLANT
SCOPETTES 8  STERILE (MISCELLANEOUS) ×2
SCOPETTES 8 STERILE (MISCELLANEOUS) ×2 IMPLANT
SPONGE SURGIFOAM ABS GEL 12-7 (HEMOSTASIS) IMPLANT
SUT VIC AB 0 CT1 27 (SUTURE) ×6
SUT VIC AB 0 CT1 27XBRD ANBCTR (SUTURE) ×3 IMPLANT
TOWEL OR 17X24 6PK STRL BLUE (TOWEL DISPOSABLE) ×4 IMPLANT
TUBING NON-CON 1/4 X 20 CONN (TUBING) ×2 IMPLANT
WATER STERILE IRR 1000ML POUR (IV SOLUTION) ×2 IMPLANT
YANKAUER SUCT BULB TIP NO VENT (SUCTIONS) ×2 IMPLANT

## 2015-06-03 NOTE — H&P (Signed)
  Date of Initial H&P: 06/02/2015  History reviewed, patient examined, no change in status, stable for surgery. 

## 2015-06-03 NOTE — Transfer of Care (Signed)
Immediate Anesthesia Transfer of Care Note  Patient: Erin Meadows  Procedure(s) Performed: Procedure(s): CONIZATION CERVIX WITH BIOPSY (N/A)  Patient Location: PACU  Anesthesia Type:General  Level of Consciousness: awake, alert  and oriented  Airway & Oxygen Therapy: Patient Spontanous Breathing and Patient connected to nasal cannula oxygen  Post-op Assessment: Report given to RN and Post -op Vital signs reviewed and stable  Post vital signs: Reviewed and stable  Last Vitals:  Filed Vitals:   06/03/15 1056  BP: 147/73  Pulse: 108  Temp: 37.4 C  Resp: 20    Complications: No apparent anesthesia complications

## 2015-06-03 NOTE — Discharge Instructions (Signed)
Conization of the Cervix, Care After °Refer to this sheet in the next few weeks. These instructions provide you with information on caring for yourself after your procedure. Your health care provider may also give you more specific instructions. Your treatment has been planned according to current medical practices but problems sometimes occur. Call your health care provider if you have any problems or questions after your procedure. °WHAT TO EXPECT AFTER THE PROCEDURE °After your procedure, it is typical to have the following sensations: °· If you had a general anesthetic, you may be groggy for 2-3 hours after the procedure. °· You may have cramps (similar to menstrual cramps) for about 1 week.   °· You may have a bloody discharge or light to moderate bleeding for 1-2 weeks.  The bleeding should not be heavy (for example, it should not soak 1 pad in less than 1 hour). °· You may have a black vaginal discharge that looks similar to coffee grounds. This is from the paste that was applied to the cervix to control bleeding. This is normal. °Recovery may take up to 3 weeks.  °HOME CARE INSTRUCTIONS  °· Arrange for someone to drive you home after the procedure. °· Only take medicines as directed by your health care provider. Do not take aspirin. It can cause bleeding.   °· Take showers for the first week. Do not take baths, swim, or use hot tubs until your health care provider says it is okay.   °· Do not douche, use tampons, or have sexual intercourse until your health care provider says it is okay.   °· Avoid strenuous activities, exercises, and heavy lifting for at least 7-14 days. °· You may resume your normal diet unless your health care provider advises you differently.     °· If you are constipated, you may: °¨ Take a mild laxative as directed by your health care provider.   °¨ Add fruit and bran to your diet.   °¨ Make sure to drink enough fluids to keep your urine clear or pale yellow. °· Keep follow-up  appointments with your health care provider. °SEEK MEDICAL CARE IF:  °· You develop a rash.   °· You are dizzy or lightheaded.   °· You feel nauseous.   °· You develop a bad smelling vaginal discharge. °SEEK IMMEDIATE MEDICAL CARE IF:  °· You have blood clots or bleeding that is heavier than a normal menstrual period (for example, soaking a pad in less than 1 hour) or you develop bright red bleeding.   °· You have a fever over 101°F (38.3°C) or persistent symptoms for more than 2-3 days.   °· You have a fever over 101°F (38.3°C) and your symptoms suddenly get worse. °· You have increasing cramps.   °· You faint.   °· You have pain when urinating. °· You have bloody urine.   °· You start vomiting.   °· Your pain is not relieved with your medicine.   °· Your have severe or worsening pain. °MAKE SURE YOU: °· Understand these instructions. °· Will watch your condition. °· Will get help right away if you are not doing well or get worse. °  °This information is not intended to replace advice given to you by your health care provider. Make sure you discuss any questions you have with your health care provider. °  °Document Released: 01/31/2005 Document Revised: 02/05/2013 Document Reviewed: 07/27/2012 °Elsevier Interactive Patient Education ©2016 [employer] Inc[employer] ° °

## 2015-06-03 NOTE — Anesthesia Procedure Notes (Signed)
Procedure Name: LMA Insertion Date/Time: 06/03/2015 12:13 PM Performed by: Cleda ClarksBROWDER, Erin Wrightson R Pre-anesthesia Checklist: Patient identified, Timeout performed, Emergency Drugs available, Suction available and Patient being monitored Patient Re-evaluated:Patient Re-evaluated prior to inductionOxygen Delivery Method: Circle system utilized Preoxygenation: Pre-oxygenation with 100% oxygen Intubation Type: IV induction Ventilation: Mask ventilation without difficulty LMA: LMA inserted LMA Size: 4.0 Number of attempts: 1 Tube secured with: Tape Dental Injury: Teeth and Oropharynx as per pre-operative assessment

## 2015-06-03 NOTE — Op Note (Signed)
06/03/2015  1:30 PM  PATIENT:  Erin Meadows  40 y.o. female  PRE-OPERATIVE DIAGNOSIS:  N87.1 CIN II  POST-OPERATIVE DIAGNOSIS:  N87.1 CIN II  PROCEDURE:  Procedure(s): CONIZATION CERVIX WITH BIOPSY (N/A)  SURGEON:  Surgeon(s) and Role:    * Gerald Leitzara Jakai Onofre, MD - Primary  PHYSICIAN ASSISTANT:   ASSISTANTS: none   ANESTHESIA:   general  EBL:  Total I/O In: 1100 [I.V.:1100] Out: 125 [Urine:25; Blood:100]  BLOOD ADMINISTERED:none  DRAINS: none   LOCAL MEDICATIONS USED:  NONE  SPECIMEN:  Source of Specimen:  Conization of cervix   DISPOSITION OF SPECIMEN:  PATHOLOGY  COUNTS:  YES  TOURNIQUET:  * No tourniquets in log *  DICTATION: .Dragon Dictation  PLAN OF CARE: Discharge to home after PACU  PATIENT DISPOSITION:  PACU - hemodynamically stable.   Delay start of Pharmacological VTE agent (>24hrs) due to surgical blood loss or risk of bleeding: not applicable  EBL 100 cc  Findings normal appearing cervix.. With the application of lugols acetowhite  noted at the 12 o'clock position   Procedure. Pt was taken to the operating room where she was placed under general anesthesia. Time out was performed. She was prepped and draped in a sterile fashion. Speculum was placed. A suture of 0 vicryl was placed at the 3 and 9 o'clock positions and used for retraction. The single toothed tenaculum was removed. Lugol's  Was placed on the cervix with the findings noted above. Scalpel was used to excise a cone portion of the cervix. The specimen was marked with a suture at the  6 o'clock position and sent to pathology. Hemostasis was obtained with roller ball and Monsel. surgicel was placed in the wound bed.  Excellent hemostasis was noted.  Sponge lap and needle counts were correct x 2. Pt was awakened from anesthesia and taken to the recovery room in stable condition.

## 2015-06-03 NOTE — Anesthesia Postprocedure Evaluation (Signed)
Anesthesia Post Note  Patient: Erin Meadows  Procedure(s) Performed: Procedure(s) (LRB): CONIZATION CERVIX WITH BIOPSY (N/A)  Patient location during evaluation: PACU Anesthesia Type: General Level of consciousness: awake and alert Pain management: pain level controlled Vital Signs Assessment: post-procedure vital signs reviewed and stable Respiratory status: spontaneous breathing, nonlabored ventilation, respiratory function stable and patient connected to nasal cannula oxygen Cardiovascular status: blood pressure returned to baseline and stable Postop Assessment: no signs of nausea or vomiting Anesthetic complications: no    Last Vitals:  Filed Vitals:   06/03/15 1315 06/03/15 1317  BP: 132/67   Pulse: 85 93  Temp: 36.7 C   Resp: 16 16    Last Pain: There were no vitals filed for this visit.               Sebastian Acheheodore Garald Rhew

## 2015-06-05 ENCOUNTER — Encounter (HOSPITAL_COMMUNITY): Payer: Self-pay | Admitting: Obstetrics and Gynecology

## 2015-12-29 ENCOUNTER — Other Ambulatory Visit: Payer: Self-pay | Admitting: Obstetrics and Gynecology

## 2015-12-29 ENCOUNTER — Other Ambulatory Visit (HOSPITAL_COMMUNITY)
Admission: RE | Admit: 2015-12-29 | Discharge: 2015-12-29 | Disposition: A | Discharge status: Home / Self Care | Payer: 59 | Source: Ambulatory Visit | Attending: Obstetrics and Gynecology | Admitting: Obstetrics and Gynecology

## 2015-12-29 DIAGNOSIS — Z1151 Encounter for screening for human papillomavirus (HPV): Secondary | ICD-10-CM | POA: Insufficient documentation

## 2015-12-29 DIAGNOSIS — Z01411 Encounter for gynecological examination (general) (routine) with abnormal findings: Secondary | ICD-10-CM | POA: Insufficient documentation

## 2016-01-01 LAB — CYTOLOGY - PAP
DIAGNOSIS: NEGATIVE
HPV (WINDOPATH): NOT DETECTED

## 2017-06-29 DIAGNOSIS — R05 Cough: Secondary | ICD-10-CM | POA: Diagnosis not present

## 2017-07-12 DIAGNOSIS — Z01419 Encounter for gynecological examination (general) (routine) without abnormal findings: Secondary | ICD-10-CM | POA: Diagnosis not present

## 2017-07-12 DIAGNOSIS — Z3041 Encounter for surveillance of contraceptive pills: Secondary | ICD-10-CM | POA: Diagnosis not present

## 2017-09-29 DIAGNOSIS — J029 Acute pharyngitis, unspecified: Secondary | ICD-10-CM | POA: Diagnosis not present

## 2017-11-11 DIAGNOSIS — H9202 Otalgia, left ear: Secondary | ICD-10-CM | POA: Diagnosis not present

## 2017-12-04 ENCOUNTER — Encounter: Payer: Self-pay | Admitting: Family Medicine

## 2017-12-04 ENCOUNTER — Other Ambulatory Visit: Payer: Self-pay

## 2017-12-04 ENCOUNTER — Ambulatory Visit (INDEPENDENT_AMBULATORY_CARE_PROVIDER_SITE_OTHER): Payer: 59 | Admitting: Family Medicine

## 2017-12-04 VITALS — BP 122/88 | HR 94 | Temp 98.1°F | Resp 16 | Ht 63.0 in | Wt 171.0 lb

## 2017-12-04 DIAGNOSIS — R61 Generalized hyperhidrosis: Secondary | ICD-10-CM | POA: Diagnosis not present

## 2017-12-04 DIAGNOSIS — R1013 Epigastric pain: Secondary | ICD-10-CM | POA: Diagnosis not present

## 2017-12-04 DIAGNOSIS — G8929 Other chronic pain: Secondary | ICD-10-CM | POA: Diagnosis not present

## 2017-12-04 NOTE — Progress Notes (Signed)
Chief Complaint  Patient presents with  . Establish Care    HPI  Patient reports that she would like to establish care with a PCP  Going back to 2013 She had a stomach issue Now she gets more frequent heart burn She has been taking tums   She reports that she has sweating under the arm pit She states that the drysol  She had itching that was severe from the antiperspirant deoderants    Past Medical History:  Diagnosis Date  . Hypertension    mild no meds  . Seasonal allergies     Current Outpatient Medications  Medication Sig Dispense Refill  . levonorgestrel-ethinyl estradiol (ORSYTHIA) 0.1-20 MG-MCG tablet Take 1 tablet by mouth daily.    . cetirizine (ZYRTEC) 10 MG tablet Take 10 mg by mouth at bedtime.    . Cholecalciferol (VITAMIN D) 2000 units tablet Take 2,000 Units by mouth daily.     No current facility-administered medications for this visit.     Allergies: No Known Allergies  Past Surgical History:  Procedure Laterality Date  . CERVICAL CONIZATION W/BX N/A 06/03/2015   Procedure: CONIZATION CERVIX WITH BIOPSY;  Surgeon: Gerald Leitz, MD;  Location: [facility];  Service: Gynecology;  Laterality: N/A;    Social History   Socioeconomic History  . Marital status: Divorced    Spouse name: Not on file  . Number of children: Not on file  . Years of education: Not on file  . Highest education level: Not on file  Occupational History  . Not on file  Social Needs  . Financial resource strain: Not on file  . Food insecurity:    Worry: Not on file    Inability: Not on file  . Transportation needs:    Medical: Not on file    Non-medical: Not on file  Tobacco Use  . Smoking status: Never Smoker  . Smokeless tobacco: Never Used  Substance and Sexual Activity  . Alcohol use: No    Comment: occasionally  . Drug use: No  . Sexual activity: Yes    Birth control/protection: Pill  Lifestyle  . Physical activity:    Days per week: Not on file    Minutes per  session: Not on file  . Stress: Not on file  Relationships  . Social connections:    Talks on phone: Not on file    Gets together: Not on file    Attends religious service: Not on file    Active member of club or organization: Not on file    Attends meetings of clubs or organizations: Not on file    Relationship status: Not on file  Other Topics Concern  . Not on file  Social History Narrative  . Not on file    Family History  Problem Relation Age of Onset  . Hypertension Father   . Heart attack Father   . Hypertension Brother      ROS Review of Systems See HPI Constitution: No fevers or chills No malaise No diaphoresis Skin: No rash or itching Eyes: no blurry vision, no double vision GU: no dysuria or hematuria Neuro: no dizziness or headaches  all others reviewed and negative   Objective: Vitals:   12/04/17 1058 12/04/17 1142  BP: (!) 154/83 122/88  Pulse: 94   Resp: 16   Temp: 98.1 F (36.7 C)   SpO2: 96%   Weight: 171 lb (77.6 kg)   Height: 5\' 3"  (1.6 m)     Physical Exam  Constitutional: She is oriented to person, place, and time. She appears well-developed and well-nourished.  HENT:  Head: Normocephalic and atraumatic.  Eyes: Conjunctivae and EOM are normal.  Neck: Normal range of motion. No thyromegaly present.  Cardiovascular: Normal rate, regular rhythm and normal heart sounds.  No murmur heard. Pulmonary/Chest: Effort normal and breath sounds normal. No stridor. No respiratory distress. She has no wheezes.  Abdominal: Soft. Bowel sounds are normal. There is no hepatosplenomegaly. There is tenderness in the epigastric area. There is no tenderness at McBurney's point and negative Murphy's sign. No hernia.  Neurological: She is alert and oriented to person, place, and time.  Skin: Skin is warm. Capillary refill takes less than 2 seconds.  Psychiatric: She has a normal mood and affect. Her behavior is normal. Judgment and thought content normal.       Clinical Data: Right lower quadrant pain, nausea, diarrhea  CT ABDOMEN AND PELVIS WITH CONTRAST  Technique:  Multidetector CT imaging of the abdomen and pelvis was performed following the standard protocol during bolus administration of intravenous contrast.  Contrast: 30mL OMNIPAQUE IOHEXOL 300 MG/ML  SOLN, OMNIPAQUE IOHEXOL 300 MG/ML  SOLN  Comparison: None.  Findings: Motion degraded images.  Lung bases are clear. Liver, spleen, pancreas, and adrenal glands are within normal limits. Gallbladder is underdistended.  No intrahepatic or extrahepatic ductal dilatation. Kidneys are within normal limits.  No hydronephrosis. No evidence of bowel obstruction.  Normal appendix.  Terminal ileum is within normal limits.  Colon is decompressed but grossly unremarkable.  No evidence of abdominal aortic aneurysm. No abdominopelvic ascites. No suspicious abdominopelvic lymphadenopathy. Uterus and bilateral ovaries are unremarkable. Bladder is underdistended but within normal limits. Visualized osseous structures are within normal limits.  IMPRESSION: Normal appendix.  No evidence of bowel obstruction.  No CT findings to account for the patient's right lower quadrant abdominal pain.  Original Report Authenticated By: Charline Bills, M.D.   Assessment and Plan Cameren was seen today for establish care.  Diagnoses and all orders for this visit:  Hyperhidrosis -     Ambulatory referral to Dermatology Has skin reaction to drysol Advised dermatology to discuss botox   Abdominal pain, chronic, epigastric -     Cancel: Basic metabolic panel -     Cancel: H. pylori breath test -     Cancel: CBC -     Cancel: Lipase -     H. pylori breath test; Future -     CBC; Future -     Basic metabolic panel; Future -     Lipase; Future  will plan to start a six week course of PPI if all other results are normal Discussed H. Pylori    Hodges Treiber A Creta Levin

## 2017-12-04 NOTE — Patient Instructions (Addendum)
If you have lab work done today you will be contacted with your lab results within the next 2 weeks.  If you have not heard from [place] then please contact us. The fastest way to get your results is to register for My Chart.   IF you received an x-ray today, you will receive an invoice from [facility]. Please contact [facility] at [phone] with questions or concerns regarding your invoice.   IF you received labwork today, you will receive an invoice from [place]. Please contact [facility] at [phone] with questions or concerns regarding your invoice.   Our billing staff will not be able to assist you with questions regarding bills from these companies.  You will be contacted with the lab results as soon as they are available. The fastest way to get your results is to activate your My Chart account. Instructions are located on the last page of this paperwork. If you have not heard from [place] regarding the results in 2 weeks, please contact this office.    Hyperhidrosis It is normal to sweat when you are hot, being physically active, or feeling anxious. Sweating is a necessary function for your body. However, hyperhidrosis is when you sweat too much (excessively). Although hyperhidrosis is not dangerous, it can make you feel embarrassed. There are two kinds of hyperhidrosis:  Primary hyperhidrosis. The sweating usually localizes in one part of your body, such as your underarms, or in a few areas, such as your feet, face, armpits, and hands. This is the more common kind of hyperhidrosis.  Secondary hyperhidrosis. This type more likely affects your entire body.  What are the causes? The cause of your hyperhidrosis depends on the kind you have.  Primary hyperhidrosis may be caused by having sweat glands that are more active than normal.  Secondary hyperhidrosis is caused by an underlying condition. Possible conditions include: ? Diabetes. ? Gout. ? Certain  medicines. ? Anxiety. ? Stroke. ? Obesity. ? Menopause. ? Overactive thyroid (hyperthyroidism). ? Tumors. ? Frostbite. ? Certain types of cancers. ? Alcoholism. ? Injury to your nervous system. ? Stroke. ? Parkinson disease.  What increases the risk? You may be at an increased risk for primary hyperhidrosis if you have a family history of it. What are the signs or symptoms? General symptoms of hyperhidrosis may include:  Feeling like you are sweating constantly, even while you are resting.  Having skin that peels or gets paler or softer in the areas where you sweat the most.  Being able to see sweat on your skin.  Symptoms of primary hyperhidrosis may include:  Sweating in specific areas, such as your armpits, palms, feet, and face.  Sweating in the same location on both sides of your body.  Sweating only during the day.  Symptoms of secondary hyperhidrosis may include:  Sweating all over your body.  Sweating even while you sleep.  How is this diagnosed? Hyperhidrosis may be diagnosed by:  Medical history and physical exam.  Testing, such as: ? Sweat test. ? Paper test.  How is this treated? Your treatment will depend on the kind of hyperhidrosis you have and the parts of your body that are affected. If your hyperhidrosis is caused by an underlying condition, your treatment will address the cause. Treatment may include:  Strong antiperspirants. Your health care provider may give you a prescription.  Medicines taken by mouth.  Medicines injected by your health care provider. These may include small amounts of botulinum toxin.  Iontophoresis. This is a procedure that temporarily turns off the sweat glands in your hands and feet.  Surgery to remove your sweat glands.  Sympathectomy. This is a procedure that cuts or destroys your nerves so that they do not send a signal to sweat.  Follow these instructions at home:  Take medicines only as directed by your  health care provider.  Use antiperspirants as directed by your health care provider.  Limit or avoid foods or beverages that seem to increase your chances of sweating, such as: ? Spicy food. ? Caffeine. ? Alcohol. ? Foods that contain MSG.  If your feet sweat: ? Wear sandals, when possible. ? Do not wear cotton socks. Wear socks that remove or wick moisture from your feet. ? Wear leather shoes. ? Avoid wearing the same pair of shoes two days in a row.  Consider joining a hyperhidrosis support group. Contact a health care provider if:  You have new symptoms.  Your symptoms get worse. This information is not intended to replace advice given to you by your health care provider. Make sure you discuss any questions you have with your health care provider. Document Released: 04/01/2005 Document Revised: 07/09/2015 Document Reviewed: 09/10/2013 Elsevier Interactive Patient Education  [employer].

## 2017-12-22 ENCOUNTER — Ambulatory Visit (INDEPENDENT_AMBULATORY_CARE_PROVIDER_SITE_OTHER): Payer: 59 | Admitting: Family Medicine

## 2017-12-22 DIAGNOSIS — R1013 Epigastric pain: Secondary | ICD-10-CM | POA: Diagnosis not present

## 2017-12-22 DIAGNOSIS — G8929 Other chronic pain: Secondary | ICD-10-CM

## 2017-12-23 LAB — LIPASE: LIPASE: 26 U/L (ref 14–72)

## 2017-12-23 LAB — BASIC METABOLIC PANEL
BUN/Creatinine Ratio: 13 (ref 9–23)
BUN: 9 mg/dL (ref 6–20)
CO2: 21 mmol/L (ref 20–29)
Calcium: 9.5 mg/dL (ref 8.7–10.2)
Chloride: 102 mmol/L (ref 96–106)
Creatinine, Ser: 0.71 mg/dL (ref 0.57–1.00)
GFR calc Af Amer: 124 mL/min/{1.73_m2} (ref >59)
GFR calc non Af Amer: 108 mL/min/{1.73_m2} (ref >59)
Glucose: 94 mg/dL (ref 65–99)
Potassium: 4.2 mmol/L (ref 3.5–5.2)
Sodium: 140 mmol/L (ref 134–144)

## 2017-12-23 LAB — CBC
Hematocrit: 43.8 % (ref 34.0–46.6)
Hemoglobin: 14.8 g/dL (ref 11.1–15.9)
MCH: 31 pg (ref 26.6–33.0)
MCHC: 33.8 g/dL (ref 31.5–35.7)
MCV: 92 fL (ref 79–97)
PLATELETS: 327 10*3/uL (ref 150–450)
RBC: 4.78 x10E6/uL (ref 3.77–5.28)
RDW: 12.7 % (ref 12.3–15.4)
WBC: 7.2 10*3/uL (ref 3.4–10.8)

## 2017-12-24 LAB — H. PYLORI BREATH TEST: H PYLORI BREATH TEST: NEGATIVE

## 2018-01-08 ENCOUNTER — Encounter: Payer: Self-pay | Admitting: Family Medicine

## 2018-09-14 DIAGNOSIS — H35373 Puckering of macula, bilateral: Secondary | ICD-10-CM | POA: Diagnosis not present

## 2018-11-28 ENCOUNTER — Other Ambulatory Visit (HOSPITAL_COMMUNITY)
Admission: RE | Admit: 2018-11-28 | Discharge: 2018-11-28 | Disposition: A | Discharge status: Home / Self Care | Payer: BC Managed Care – PPO | Source: Ambulatory Visit | Attending: Obstetrics and Gynecology | Admitting: Obstetrics and Gynecology

## 2018-11-28 ENCOUNTER — Other Ambulatory Visit: Payer: Self-pay | Admitting: Obstetrics and Gynecology

## 2018-11-28 DIAGNOSIS — Z01419 Encounter for gynecological examination (general) (routine) without abnormal findings: Secondary | ICD-10-CM | POA: Insufficient documentation

## 2018-12-04 LAB — CYTOLOGY - PAP
Comment: NEGATIVE
Diagnosis: NEGATIVE
High risk HPV: NEGATIVE

## 2019-04-11 ENCOUNTER — Ambulatory Visit: Payer: No Typology Code available for payment source | Attending: Internal Medicine

## 2019-04-11 DIAGNOSIS — Z23 Encounter for immunization: Secondary | ICD-10-CM | POA: Insufficient documentation

## 2019-04-11 NOTE — Progress Notes (Signed)
   Covid-19 Vaccination Clinic  Name:  Erin Meadows    MRN: [id number] DOB: 10/28/1975  04/11/2019  [name] was observed post Covid-19 immunization for 15 minutes without incidence. She was provided with Vaccine Information Sheet and instruction to access the V-Safe system.   [name] was instructed to call 911 with any severe reactions post vaccine: Marland Kitchen Difficulty breathing  . Swelling of your face and throat  . A fast heartbeat  . A bad rash all over your body  . Dizziness and weakness    Immunizations Administered    Name Date Dose VIS Date Route   Pfizer COVID-19 Vaccine 04/11/2019 12:29 PM 0.3 mL 01/25/2019 Intramuscular   Manufacturer: [employer], [employer]   Lot: [id number]   NDC: [address]-3

## 2019-04-29 ENCOUNTER — Encounter: Payer: Self-pay | Admitting: Registered Nurse

## 2019-04-29 ENCOUNTER — Other Ambulatory Visit: Payer: Self-pay

## 2019-04-29 ENCOUNTER — Ambulatory Visit (INDEPENDENT_AMBULATORY_CARE_PROVIDER_SITE_OTHER): Payer: BC Managed Care – PPO | Admitting: Registered Nurse

## 2019-04-29 VITALS — BP 132/73 | HR 96 | Temp 97.9°F | Ht 63.0 in | Wt 176.8 lb

## 2019-04-29 DIAGNOSIS — R590 Localized enlarged lymph nodes: Secondary | ICD-10-CM

## 2019-04-29 NOTE — Patient Instructions (Addendum)
Ms Erin Meadows to meet you today. Your exam was reassuring - It seems that you're experiencing a mild reaction to your COVID-19 vaccine.  As you noted, there is a higher incidence of side effects with the second injection. If you wanted, you could take 200-400mg  of ibuprofen and a benadryl before the second injection (or shortly after) to try to limit side effects. Additionally, staying very well hydrated and eating routine, healthy meals can help.  Additionally, I have provided you with a preemptive work note in case you experience any significant reaction to the second injection.  Please return as needed if your symptoms worsen or fail to improve   If you have lab work done today you will be contacted with your lab results within the next 2 weeks.  If you have not heard from [place] then please contact us. The fastest way to get your results is to register for My Chart.   IF you received an x-ray today, you will receive an invoice from [facility]. Please contact [facility] at [phone] with questions or concerns regarding your invoice.   IF you received labwork today, you will receive an invoice from [place]. Please contact [facility] at [phone] with questions or concerns regarding your invoice.   Our billing staff will not be able to assist you with questions regarding bills from these companies.  You will be contacted with the lab results as soon as they are available. The fastest way to get your results is to activate your My Chart account. Instructions are located on the last page of this paperwork. If you have not heard from [place] regarding the results in 2 weeks, please contact this office.

## 2019-04-29 NOTE — Progress Notes (Signed)
Acute Office Visit  Subjective:    Patient ID: Erin Meadows, female    DOB: 1975-11-15, 44 y.o.   MRN: [id number]  Chief Complaint  Patient presents with  . 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 per patient she has not put anything on it or have a sore throat , fever, or anything else     HPI Patient is in today for supraclavicular lymphadenopathy.  L side - a few weeks ago received her first COVID-19 vaccine. Some tenderness, pressure, pain on supraclavicular chain on L side. It is mild. No recent signs of sickness or infection. Axillary lymph node not involved. No wounds on LUE. No breast abnormalities. No other concerns at this time.   Past Medical History:  Diagnosis Date  . Hypertension    mild no meds  . Seasonal allergies     Past Surgical History:  Procedure Laterality Date  . CERVICAL CONIZATION W/BX N/A 06/03/2015   Procedure: CONIZATION CERVIX WITH BIOPSY;  Surgeon: Christophe Louis, MD;  Location: [facility];  Service: Gynecology;  Laterality: N/A;    Family History  Problem Relation Age of Onset  . Hypertension Father   . Heart attack Father   . Hypertension Brother     Social History   Socioeconomic History  . Marital status: Divorced    Spouse name: Not on file  . Number of children: Not on file  . Years of education: Not on file  . Highest education level: Not on file  Occupational History  . Not on file  Tobacco Use  . Smoking status: Never Smoker  . Smokeless tobacco: Never Used  Substance and Sexual Activity  . Alcohol use: No    Comment: occasionally  . Drug use: No  . Sexual activity: Yes    Birth control/protection: Pill  Other Topics Concern  . Not on file  Social History Narrative  . Not on file   Social Determinants of Health   Financial Resource Strain:   . Difficulty of Paying Living Expenses:   Food Insecurity:   .  Worried About Charity fundraiser in the Last Year:   . Arboriculturist in the Last Year:   Transportation Needs:   . Film/video editor (Medical):   Marland Kitchen Lack of Transportation (Non-Medical):   Physical Activity:   . Days of Exercise per Week:   . Minutes of Exercise per Session:   Stress:   . Feeling of Stress :   Social Connections:   . Frequency of Communication with Friends and Family:   . Frequency of Social Gatherings with Friends and Family:   . Attends Religious Services:   . Active Member of Clubs or Organizations:   . Attends Archivist Meetings:   Marland Kitchen Marital Status:   Intimate Partner Violence:   . Fear of Current or Ex-Partner:   . Emotionally Abused:   Marland Kitchen Physically Abused:   . Sexually Abused:     Outpatient Medications Prior to Visit  Medication Sig Dispense Refill  . cetirizine (ZYRTEC) 10 MG tablet Take 10 mg by mouth at bedtime.    . Cholecalciferol (VITAMIN D) 2000 units tablet Take 2,000 Units by mouth daily.    Marland Kitchen levonorgestrel-ethinyl estradiol (ORSYTHIA) 0.1-20 MG-MCG tablet Take 1 tablet by mouth daily.     No facility-administered medications prior  to visit.    No Known Allergies  Review of Systems  Constitutional: Negative.   HENT: Negative.   Eyes: Negative.   Respiratory: Negative.   Cardiovascular: Negative.   Gastrointestinal: Negative.   Endocrine: Negative.   Genitourinary: Negative.   Musculoskeletal: Negative.   Skin: Negative.   Allergic/Immunologic: Negative.   Neurological: Negative.   Hematological: Positive for adenopathy. Does not bruise/bleed easily.  Psychiatric/Behavioral: Negative.   All other systems reviewed and are negative.      Objective:    Physical Exam Vitals and nursing note reviewed.  Constitutional:      General: She is not in acute distress.    Appearance: Normal appearance. She is obese. She is not ill-appearing, toxic-appearing or diaphoretic.  HENT:     Head: Normocephalic and  atraumatic.     Right Ear: Tympanic membrane, ear canal and external ear normal. There is no impacted cerumen.     Left Ear: Tympanic membrane, ear canal and external ear normal. There is no impacted cerumen.     Mouth/Throat:     Mouth: Mucous membranes are moist.     Pharynx: Oropharynx is clear. No oropharyngeal exudate or posterior oropharyngeal erythema.  Neck:     Vascular: No carotid bruit.  Pulmonary:     Effort: Pulmonary effort is normal. No respiratory distress.  Musculoskeletal:        General: Tenderness (L supraclavicular, mild, "pressure") present. No swelling, deformity or signs of injury. Normal range of motion.     Cervical back: Normal range of motion and neck supple. No rigidity or tenderness.     Right lower leg: No edema.     Left lower leg: No edema.  Lymphadenopathy:     Cervical: No cervical adenopathy.  Skin:    General: Skin is warm and dry.     Coloration: Skin is not jaundiced or pale.     Findings: No bruising, erythema, lesion or rash.  Neurological:     General: No focal deficit present.     Mental Status: She is alert and oriented to person, place, and time. Mental status is at baseline.     Cranial Nerves: No cranial nerve deficit.  Psychiatric:        Mood and Affect: Mood normal.        Behavior: Behavior normal.        Thought Content: Thought content normal.        Judgment: Judgment normal.     BP 132/73   Pulse 96   Temp 97.9 F (36.6 C) (Temporal)   Ht 5\' 3"  (1.6 m)   Wt 176 lb 12.8 oz (80.2 kg)   LMP  (LMP Unknown)   SpO2 99%   BMI 31.32 kg/m  Wt Readings from Last 3 Encounters:  04/29/19 176 lb 12.8 oz (80.2 kg)  12/04/17 171 lb (77.6 kg)  05/22/15 140 lb (63.5 kg)    Health Maintenance Due  Topic Date Due  . HIV Screening  Never done    There are no preventive care reminders to display for this patient.   Lab Results  Component Value Date   TSH 1.094 06/11/2014   Lab Results  Component Value Date   WBC 7.2  12/22/2017   HGB 14.8 12/22/2017   HCT 43.8 12/22/2017   MCV 92 12/22/2017   PLT 327 12/22/2017   Lab Results  Component Value Date   NA 140 12/22/2017   K 4.2 12/22/2017   CO2 21 12/22/2017  GLUCOSE 94 12/22/2017   BUN 9 12/22/2017   CREATININE 0.71 12/22/2017   CALCIUM 9.5 12/22/2017   No results found for: CHOL No results found for: HDL No results found for: LDLCALC No results found for: TRIG No results found for: CHOLHDL No results found for: WNIO2V     Assessment & Plan:   Problem List Items Addressed This Visit    None    Visit Diagnoses    Supraclavicular adenopathy    -  Primary       No orders of the defined types were placed in this encounter.  PLAN  Pt reports mild tenderness, though supraclavicular chain not palpable on exam today. Lymphadenopathy vs. MSK etiology  Pt seems fit for second covid vaccine injection by medical standards. Suggest that she may use ibuprofen and benadryl to try to limit injection side effects if desired.  Return to clinic precautions given, pt demonstrates understanding  Patient encouraged to call clinic with any questions, comments, or concerns.  Janeece Agee, NP

## 2019-04-30 ENCOUNTER — Ambulatory Visit: Payer: 59 | Admitting: Registered Nurse

## 2019-05-07 ENCOUNTER — Ambulatory Visit: Payer: BC Managed Care – PPO | Attending: Internal Medicine

## 2019-05-07 DIAGNOSIS — Z23 Encounter for immunization: Secondary | ICD-10-CM

## 2019-05-07 NOTE — Progress Notes (Signed)
   Covid-19 Vaccination Clinic  Name:  Erin Meadows    MRN: [id number] DOB: [birth year]  05/07/2019  [name] was observed post Covid-19 immunization for 15 minutes without incident. She was provided with Vaccine Information Sheet and instruction to access the V-Safe system.   [name] was instructed to call 911 with any severe reactions post vaccine: Marland Kitchen Difficulty breathing  . Swelling of face and throat  . A fast heartbeat  . A bad rash all over body  . Dizziness and weakness   Immunizations Administered    Name Date Dose VIS Date Route   Pfizer COVID-19 Vaccine 05/07/2019 12:49 PM 0.3 mL 01/25/2019 Intramuscular   Manufacturer: [employer], [employer]   Lot: [id number]   NDC: [address]-6

## 2019-06-13 ENCOUNTER — Other Ambulatory Visit: Payer: Self-pay | Admitting: Obstetrics and Gynecology

## 2019-06-13 DIAGNOSIS — Z1231 Encounter for screening mammogram for malignant neoplasm of breast: Secondary | ICD-10-CM

## 2019-07-01 ENCOUNTER — Other Ambulatory Visit: Payer: Self-pay

## 2019-07-01 ENCOUNTER — Ambulatory Visit
Admission: RE | Admit: 2019-07-01 | Discharge: 2019-07-01 | Disposition: A | Discharge status: Home / Self Care | Payer: BC Managed Care – PPO | Source: Ambulatory Visit

## 2019-07-01 DIAGNOSIS — Z1231 Encounter for screening mammogram for malignant neoplasm of breast: Secondary | ICD-10-CM

## 2019-10-04 ENCOUNTER — Ambulatory Visit: Payer: BC Managed Care – PPO | Admitting: Family Medicine

## 2019-10-14 DIAGNOSIS — R05 Cough: Secondary | ICD-10-CM | POA: Diagnosis not present

## 2019-10-14 DIAGNOSIS — Z20828 Contact with and (suspected) exposure to other viral communicable diseases: Secondary | ICD-10-CM | POA: Diagnosis not present

## 2019-10-15 DIAGNOSIS — R05 Cough: Secondary | ICD-10-CM | POA: Diagnosis not present

## 2019-10-15 DIAGNOSIS — Z20828 Contact with and (suspected) exposure to other viral communicable diseases: Secondary | ICD-10-CM | POA: Diagnosis not present

## 2019-10-30 DIAGNOSIS — Z20822 Contact with and (suspected) exposure to covid-19: Secondary | ICD-10-CM | POA: Diagnosis not present

## 2019-12-03 DIAGNOSIS — Z01419 Encounter for gynecological examination (general) (routine) without abnormal findings: Secondary | ICD-10-CM | POA: Diagnosis not present

## 2020-07-28 ENCOUNTER — Ambulatory Visit (INDEPENDENT_AMBULATORY_CARE_PROVIDER_SITE_OTHER): Payer: BC Managed Care – PPO | Admitting: Registered Nurse

## 2020-07-28 ENCOUNTER — Other Ambulatory Visit: Payer: Self-pay

## 2020-07-28 ENCOUNTER — Encounter: Payer: Self-pay | Admitting: Registered Nurse

## 2020-07-28 VITALS — BP 147/69 | HR 80 | Temp 98.2°F | Resp 18 | Ht 63.0 in | Wt 172.0 lb

## 2020-07-28 DIAGNOSIS — M791 Myalgia, unspecified site: Secondary | ICD-10-CM

## 2020-07-28 DIAGNOSIS — W57XXXA Bitten or stung by nonvenomous insect and other nonvenomous arthropods, initial encounter: Secondary | ICD-10-CM | POA: Diagnosis not present

## 2020-07-28 LAB — CBC
HCT: 46.5 % — ABNORMAL HIGH (ref 36.0–46.0)
Hemoglobin: 15.7 g/dL — ABNORMAL HIGH (ref 12.0–15.0)
MCHC: 33.8 g/dL (ref 30.0–36.0)
MCV: 92.6 fl (ref 78.0–100.0)
Platelets: 339 K/uL (ref 150.0–400.0)
RBC: 5.02 Mil/uL (ref 3.87–5.11)
RDW: 13 % (ref 11.5–15.5)
WBC: 8.5 K/uL (ref 4.0–10.5)

## 2020-07-28 LAB — COMPREHENSIVE METABOLIC PANEL WITH GFR
ALT: 14 U/L (ref 0–35)
AST: 13 U/L (ref 0–37)
Albumin: 4.8 g/dL (ref 3.5–5.2)
Alkaline Phosphatase: 80 U/L (ref 39–117)
BUN: 10 mg/dL (ref 6–23)
CO2: 24 meq/L (ref 19–32)
Calcium: 9.9 mg/dL (ref 8.4–10.5)
Chloride: 102 meq/L (ref 96–112)
Creatinine, Ser: 0.66 mg/dL (ref 0.40–1.20)
GFR: 108.14 mL/min
Glucose, Bld: 126 mg/dL — ABNORMAL HIGH (ref 70–99)
Potassium: 4.3 meq/L (ref 3.5–5.1)
Sodium: 138 meq/L (ref 135–145)
Total Bilirubin: 0.4 mg/dL (ref 0.2–1.2)
Total Protein: 7.7 g/dL (ref 6.0–8.3)

## 2020-07-28 LAB — CK: Total CK: 45 U/L (ref 7–177)

## 2020-07-28 MED ORDER — CYCLOBENZAPRINE HCL 5 MG PO TABS: 5.0000 mg | ORAL_TABLET | Freq: Every evening | ORAL | 1 refills | Status: DC | PRN

## 2020-07-28 MED ORDER — TRIAMCINOLONE ACETONIDE 0.1 % EX CREA: 1.0000 "application " | TOPICAL_CREAM | Freq: Two times a day (BID) | CUTANEOUS | 0 refills | Status: DC

## 2020-07-28 NOTE — Addendum Note (Signed)
Addended by: Ilda Foil on: 07/28/2020 01:35 PM   Modules accepted: Orders

## 2020-07-28 NOTE — Progress Notes (Signed)
Acute Office Visit  Subjective:    Patient ID: Erin Meadows, female    DOB: 17-Nov-1975, 45 y.o.   MRN: [id number]  Chief Complaint  Patient presents with   Leg Pain    Patient states she got bite twice by a bug on 07/01/2020. Patient states the bites went away but starting to have leg and back pain.    HPI Patient is in today for leg pain  Bilateral Onset a few days ago - not long after but bites on both legs No latched insect  Some fatigue but otherwise no systemic symptoms Has not had tick bite before No hx of tick borne illness Some mild lower back pain but now resolved.  Past Medical History:  Diagnosis Date   Hypertension    mild no meds   Seasonal allergies     Past Surgical History:  Procedure Laterality Date   CERVICAL CONIZATION W/BX N/A 06/03/2015   Procedure: CONIZATION CERVIX WITH BIOPSY;  Surgeon: Gerald Leitz, MD;  Location: [facility];  Service: Gynecology;  Laterality: N/A;    Family History  Problem Relation Age of Onset   Hypertension Father    Heart attack Father    Hypertension Brother     Social History   Socioeconomic History   Marital status: Divorced    Spouse name: Not on file   Number of children: Not on file   Years of education: Not on file   Highest education level: Not on file  Occupational History   Not on file  Tobacco Use   Smoking status: Never   Smokeless tobacco: Never  Substance and Sexual Activity   Alcohol use: No    Comment: occasionally   Drug use: No   Sexual activity: Yes    Birth control/protection: Pill  Other Topics Concern   Not on file  Social History Narrative   Not on file   Social Determinants of Health   Financial Resource Strain: Not on file  Food Insecurity: Not on file  Transportation Needs: Not on file  Physical Activity: Not on file  Stress: Not on file  Social Connections: Not on file  Intimate Partner Violence: Not on file    Outpatient Medications Prior to Visit  Medication Sig  Dispense Refill   cetirizine (ZYRTEC) 10 MG tablet Take 10 mg by mouth at bedtime.     levonorgestrel-ethinyl estradiol (ALESSE) 0.1-20 MG-MCG tablet Take 1 tablet by mouth daily.     Cholecalciferol (VITAMIN D) 2000 units tablet Take 2,000 Units by mouth daily.     No facility-administered medications prior to visit.    No Known Allergies  Review of Systems Per hpi, otherwise negative    Objective:    Physical Exam Vitals and nursing note reviewed.  Constitutional:      General: She is not in acute distress.    Appearance: Normal appearance. She is not ill-appearing, toxic-appearing or diaphoretic.  Cardiovascular:     Rate and Rhythm: Normal rate and regular rhythm.     Pulses: Normal pulses.     Heart sounds: Normal heart sounds. No murmur heard.   No friction rub. No gallop.  Pulmonary:     Effort: Pulmonary effort is normal. No respiratory distress.     Breath sounds: Normal breath sounds. No stridor. No wheezing, rhonchi or rales.  Chest:     Chest wall: No tenderness.  Skin:    General: Skin is warm and dry.     Capillary Refill: Capillary refill takes  less than 2 seconds.     Findings: Lesion (two insect bites on lower legs. resolving) present.  Neurological:     General: No focal deficit present.     Mental Status: She is alert and oriented to person, place, and time. Mental status is at baseline.  Psychiatric:        Mood and Affect: Mood normal.        Behavior: Behavior normal.        Thought Content: Thought content normal.        Judgment: Judgment normal.    BP (!) 147/69   Pulse 80   Temp 98.2 F (36.8 C) (Temporal)   Resp 18   Ht 5\' 3"  (1.6 m)   Wt 172 lb (78 kg)   SpO2 97%   BMI 30.47 kg/m  Wt Readings from Last 3 Encounters:  07/28/20 172 lb (78 kg)  04/29/19 176 lb 12.8 oz (80.2 kg)  12/04/17 171 lb (77.6 kg)    There are no preventive care reminders to display for this patient.  There are no preventive care reminders to display for  this patient.   Lab Results  Component Value Date   TSH 1.094 06/11/2014   Lab Results  Component Value Date   WBC 7.2 12/22/2017   HGB 14.8 12/22/2017   HCT 43.8 12/22/2017   MCV 92 12/22/2017   PLT 327 12/22/2017   Lab Results  Component Value Date   NA 140 12/22/2017   K 4.2 12/22/2017   CO2 21 12/22/2017   GLUCOSE 94 12/22/2017   BUN 9 12/22/2017   CREATININE 0.71 12/22/2017   CALCIUM 9.5 12/22/2017   No results found for: CHOL No results found for: HDL No results found for: LDLCALC No results found for: TRIG No results found for: CHOLHDL No results found for: 13/09/2017     Assessment & Plan:   Problem List Items Addressed This Visit   None Visit Diagnoses     Myalgia    -  Primary   Relevant Orders   CK   B. burgdorfi antibodies   Rocky mtn spotted fvr abs pnl(IgG+IgM)   Comprehensive metabolic panel   CBC   Insect bite, unspecified site, initial encounter       Relevant Orders   CK   B. burgdorfi antibodies   Rocky mtn spotted fvr abs pnl(IgG+IgM)   Comprehensive metabolic panel   CBC        No orders of the defined types were placed in this encounter.  PLAN Doubt tick bite. Will draw labs to rule out tick borne illness Suspect msk etiology. Low dose flexeril, stretching Triamcinolone for local reaction Patient encouraged to call clinic with any questions, comments, or concerns.  [id number], NP

## 2020-07-28 NOTE — Patient Instructions (Addendum)
Ms Erin Meadows news - low suspicion for tick bite or tick borne illness. Labs today will help rule that out  Let me know if symptoms progress or worsen  Ok to use flexeril 1-2 times daily for muscle aches Ok to use triamcinolone twice daily as needed for local reactions - not on face or groin  Thank you  Rich     If you have lab work done today you will be contacted with your lab results within the next 2 weeks.  If you have not heard from [place] then please contact us. The fastest way to get your results is to register for My Chart.   IF you received an x-ray today, you will receive an invoice from [facility]. Please contact [facility] at [phone] with questions or concerns regarding your invoice.   IF you received labwork today, you will receive an invoice from [facility]. Please contact [facility] at [phone] with questions or concerns regarding your invoice.   Our billing staff will not be able to assist you with questions regarding bills from these companies.  You will be contacted with the lab results as soon as they are available. The fastest way to get your results is to activate your My Chart account. Instructions are located on the last page of this paperwork. If you have not heard from [place] regarding the results in 2 weeks, please contact this office.

## 2020-07-29 LAB — ROCKY MTN SPOTTED FVR ABS PNL(IGG+IGM)
RMSF IgG: NOT DETECTED
RMSF IgM: NOT DETECTED

## 2020-07-29 LAB — B. BURGDORFI ANTIBODIES: B burgdorferi Ab IgG+IgM: <0.9 index

## 2020-09-24 DIAGNOSIS — H35373 Puckering of macula, bilateral: Secondary | ICD-10-CM | POA: Diagnosis not present

## 2020-11-10 ENCOUNTER — Other Ambulatory Visit: Payer: Self-pay

## 2020-11-10 ENCOUNTER — Encounter: Payer: Self-pay | Admitting: Registered Nurse

## 2020-11-10 ENCOUNTER — Ambulatory Visit (INDEPENDENT_AMBULATORY_CARE_PROVIDER_SITE_OTHER): Payer: BC Managed Care – PPO | Admitting: Registered Nurse

## 2020-11-10 ENCOUNTER — Ambulatory Visit: Payer: BC Managed Care – PPO | Admitting: Registered Nurse

## 2020-11-10 VITALS — BP 140/75 | HR 79 | Temp 98.2°F | Resp 18 | Ht 63.0 in | Wt 181.4 lb

## 2020-11-10 DIAGNOSIS — M79661 Pain in right lower leg: Secondary | ICD-10-CM | POA: Diagnosis not present

## 2020-11-10 NOTE — Progress Notes (Signed)
Established Patient Office Visit  Subjective:  Patient ID: Erin Meadows, female    DOB: 1975-08-22  Age: 45 y.o. MRN: [id number]  CC:  Chief Complaint  Patient presents with   Follow-up    Patient states she was in here in June for leg pain and she is still having pain mostly in the right calf area.    HPI Erin Meadows presents for ongoing R leg pain  Mostly in R lower leg.  Had had recent tick bite, serology negative for lyme, rmsf. Tried low dose flexeril. Limited relief. Used intermittently.   Pain in posterior R calf. Midline. About 2-3 inches of pain around halfway down calf.  No swelling, distal pain, redness. No acute injury or trauma.  Worse pain when sitting for a long time, then activity.  Somewhat improves with movement. Worse in the mornings after sleep.  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   Has not had this happen before.   Past Medical History:  Diagnosis Date   Hypertension    mild no meds   Seasonal allergies     Past Surgical History:  Procedure Laterality Date   CERVICAL CONIZATION W/BX N/A 06/03/2015   Procedure: CONIZATION CERVIX WITH BIOPSY;  Surgeon: Gerald Leitz, MD;  Location: [facility];  Service: Gynecology;  Laterality: N/A;    Family History  Problem Relation Age of Onset   Hypertension Father    Heart attack Father    Hyperlipidemia Brother    Hypertension Brother     Social History   Socioeconomic History   Marital status: Divorced    Spouse name: Not on file   Number of children: Not on file   Years of education: Not on file   Highest education level: Not on file  Occupational History   Not on file  Tobacco Use   Smoking status: Never   Smokeless tobacco: Never  Substance and Sexual Activity   Alcohol use: No    Comment: occasionally   Drug use: No   Sexual activity: Yes    Birth control/protection: Pill  Other Topics Concern   Not on file  Social History  Narrative   Not on file   Social Determinants of Health   Financial Resource Strain: Not on file  Food Insecurity: Not on file  Transportation Needs: Not on file  Physical Activity: Not on file  Stress: Not on file  Social Connections: Not on file  Intimate Partner Violence: Not on file    Outpatient Medications Prior to Visit  Medication Sig Dispense Refill   cetirizine (ZYRTEC) 10 MG tablet Take 10 mg by mouth at bedtime.     cyclobenzaprine (FLEXERIL) 5 MG tablet Take 1 tablet (5 mg total) by mouth at bedtime as needed for muscle spasms. 30 tablet 1   levonorgestrel-ethinyl estradiol (ALESSE) 0.1-20 MG-MCG tablet Take 1 tablet by mouth daily.     triamcinolone cream (KENALOG) 0.1 % Apply 1 application topically 2 (two) times daily. 30 g 0   No facility-administered medications prior to visit.    No Known Allergies  ROS Review of Systems  Constitutional: Negative.   HENT: Negative.    Eyes: Negative.   Respiratory: Negative.    Cardiovascular: Negative.   Gastrointestinal: Negative.   Genitourinary: Negative.   Musculoskeletal:  Positive for myalgias.  Skin: Negative.   Neurological: Negative.   Psychiatric/Behavioral: Negative.    All other systems reviewed and are negative.  Objective:    Physical Exam Vitals and nursing note reviewed.  Constitutional:      General: She is not in acute distress.    Appearance: Normal appearance. She is normal weight. She is not ill-appearing, toxic-appearing or diaphoretic.  Cardiovascular:     Rate and Rhythm: Normal rate and regular rhythm.     Heart sounds: Normal heart sounds. No murmur heard.   No friction rub. No gallop.  Pulmonary:     Effort: Pulmonary effort is normal. No respiratory distress.     Breath sounds: Normal breath sounds. No stridor. No wheezing, rhonchi or rales.  Chest:     Chest wall: No tenderness.  Musculoskeletal:        General: Tenderness (right lower leg posterior) present.  Skin:     General: Skin is warm and dry.  Neurological:     General: No focal deficit present.     Mental Status: She is alert and oriented to person, place, and time. Mental status is at baseline.  Psychiatric:        Mood and Affect: Mood normal.        Behavior: Behavior normal.        Thought Content: Thought content normal.        Judgment: Judgment normal.    BP 140/75   Pulse 79   Temp 98.2 F (36.8 C) (Temporal)   Resp 18   Ht 5\' 3"  (1.6 m)   Wt 181 lb 6.4 oz (82.3 kg)   BMI 32.13 kg/m  Wt Readings from Last 3 Encounters:  11/10/20 181 lb 6.4 oz (82.3 kg)  07/28/20 172 lb (78 kg)  04/29/19 176 lb 12.8 oz (80.2 kg)     There are no preventive care reminders to display for this patient.  There are no preventive care reminders to display for this patient.  Lab Results  Component Value Date   TSH 1.094 06/11/2014   Lab Results  Component Value Date   WBC 8.5 07/28/2020   HGB 15.7 (H) 07/28/2020   HCT 46.5 (H) 07/28/2020   MCV 92.6 07/28/2020   PLT 339.0 07/28/2020   Lab Results  Component Value Date   NA 138 07/28/2020   K 4.3 07/28/2020   CO2 24 07/28/2020   GLUCOSE 126 (H) 07/28/2020   BUN 10 07/28/2020   CREATININE 0.66 07/28/2020   BILITOT 0.4 07/28/2020   ALKPHOS 80 07/28/2020   AST 13 07/28/2020   ALT 14 07/28/2020   PROT 7.7 07/28/2020   ALBUMIN 4.8 07/28/2020   CALCIUM 9.9 07/28/2020   GFR 108.14 07/28/2020   No results found for: CHOL No results found for: HDL No results found for: LDLCALC No results found for: TRIG No results found for: CHOLHDL No results found for: 07/30/2020    Assessment & Plan:   Problem List Items Addressed This Visit   None Visit Diagnoses     Right calf pain    -  Primary   Relevant Orders   Ambulatory referral to Orthopedics   VAS ENID7O LOWER EXTREMITY VENOUS (DVT)       No orders of the defined types were placed in this encounter.   Follow-up: No follow-ups on file.   PLAN [place] to rule out DVT. Low suspicion.   Refer to ortho. Concern for MSK etiology though unclear mechanism. Continue supportivecare. Patient encouraged to call clinic with any questions, comments, or concerns.  [place], NP

## 2020-11-10 NOTE — Patient Instructions (Signed)
Ms. Olkowski -   Randie Heinz to see you  Sorry that you're still in pain  Let's get an ultrasound to rule out clot. Once this is ruled out, can see what ortho has to say.  Thanks,  Luan Pulling

## 2020-11-17 ENCOUNTER — Ambulatory Visit: Payer: BC Managed Care – PPO | Admitting: Orthopaedic Surgery

## 2020-11-17 ENCOUNTER — Ambulatory Visit (HOSPITAL_COMMUNITY)
Admission: RE | Admit: 2020-11-17 | Discharge: 2020-11-17 | Disposition: A | Discharge status: Home / Self Care | Payer: BC Managed Care – PPO | Source: Ambulatory Visit | Attending: Registered Nurse | Admitting: Registered Nurse

## 2020-11-17 ENCOUNTER — Other Ambulatory Visit: Payer: Self-pay

## 2020-11-17 DIAGNOSIS — M79661 Pain in right lower leg: Secondary | ICD-10-CM | POA: Insufficient documentation

## 2020-11-18 ENCOUNTER — Telehealth: Payer: Self-pay

## 2020-11-18 NOTE — Telephone Encounter (Signed)
Called and left patient a detailed message on vm per pcp.

## 2020-11-18 NOTE — Telephone Encounter (Signed)
Erin Meadows. Patient wants to know if going to see ortho for pain in right leg the right thing to do being that she does not have any blood clots. Please advise

## 2020-11-18 NOTE — Telephone Encounter (Signed)
Caller name:Kim Fonnie Birkenhead   On DPR? :No   Call back number:[phone]  Provider they see: Janeece Agee   Reason for call:Pt is calling [name] went over the results of her labs but she still having pain in right calf and pt is seeing ortho on Tuesday and needs advice is this the next right step since she got back results that it was not a blood clot?

## 2020-11-18 NOTE — Telephone Encounter (Signed)
Can continue conservative measures. If worsening or not improving, can see Ortho, otherwise can cancel  Thanks  Rich

## 2020-11-20 ENCOUNTER — Telehealth: Payer: Self-pay | Admitting: Orthopaedic Surgery

## 2020-11-20 NOTE — Telephone Encounter (Signed)
Pt called wanting to check if Dr.Xu received and viewed her calf scans. Pt states she wants to make sure Dr.Xu thinks this will be something he's able to help with so she isn't wasting anyone's time.  [phone]

## 2020-11-23 NOTE — Telephone Encounter (Signed)
Do you know what she's talking about?

## 2020-11-24 ENCOUNTER — Encounter: Payer: Self-pay | Admitting: Orthopaedic Surgery

## 2020-11-24 ENCOUNTER — Ambulatory Visit (INDEPENDENT_AMBULATORY_CARE_PROVIDER_SITE_OTHER): Payer: BC Managed Care – PPO | Admitting: Orthopaedic Surgery

## 2020-11-24 ENCOUNTER — Ambulatory Visit: Payer: Self-pay

## 2020-11-24 ENCOUNTER — Other Ambulatory Visit: Payer: Self-pay

## 2020-11-24 VITALS — Ht 63.0 in | Wt 181.0 lb

## 2020-11-24 DIAGNOSIS — M79661 Pain in right lower leg: Secondary | ICD-10-CM

## 2020-11-24 NOTE — Progress Notes (Signed)
Office Visit Note   Patient: Erin Meadows           Date of Birth: [birth year]           MRN: [id number] Visit Date: 11/24/2020              Requested by: Janeece Agee, NP [address] [place] [address] [place],  [place] [address] PCP: Janeece Agee, NP   Assessment & Plan: Visit Diagnoses:  1. Pain in right lower leg     Plan: Impression is tendinopathy of the gastroc myotendinous junction.  X-rays negative and based on reported symptoms and clinical findings low suspicion for structural abnormalities other than tendinopathy.  I have recommended a period of rest and Voltaren gel and NSAIDs as needed.  May need to consider heel lift in her shoe.  Questions encouraged and answered.  Follow-up as needed.  Follow-Up Instructions: No follow-ups on file.   Orders:  Orders Placed This Encounter  Procedures   XR Tibia/Fibula Right   No orders of the defined types were placed in this encounter.     Procedures: No procedures performed   Clinical Data: No additional findings.   Subjective: Chief Complaint  Patient presents with   Right Leg - Pain    Selena Batten is a very pleasant and healthy 45 year old female here for mid calf pain on the right side since June.  Symptoms have been off and on throughout the summer.  Ruled out for Lyme disease.  Had a recent ultrasound that was negative for DVT.  She endorses start up pain and morning pain in the mid calf.  Denies any numbness and tingling or radicular symptoms.   Review of Systems  Constitutional: Negative.   HENT: Negative.    Eyes: Negative.   Respiratory: Negative.    Cardiovascular: Negative.   Endocrine: Negative.   Musculoskeletal: Negative.   Neurological: Negative.   Hematological: Negative.   Psychiatric/Behavioral: Negative.    All other systems reviewed and are negative.   Objective: Vital Signs: Ht 5\' 3"  (1.6 m)   Wt 181 lb (82.1 kg)   BMI 32.06 kg/m   Physical Exam Vitals and nursing note reviewed.   Constitutional:      Appearance: She is well-developed.  Pulmonary:     Effort: Pulmonary effort is normal.  Skin:    General: Skin is warm.     Capillary Refill: Capillary refill takes less than 2 seconds.  Neurological:     Mental Status: She is alert and oriented to person, place, and time.  Psychiatric:        Behavior: Behavior normal.        Thought Content: Thought content normal.        Judgment: Judgment normal.    Ortho Exam  Right calf shows tenderness in the myotendinous junction.  Achilles is asymptomatic.  She has good strength with knee flexion and ankle plantar flexion.  No popliteal masses.  Normal range of motion of the knee and ankle.  Specialty Comments:  No specialty comments available.  Imaging: XR Tibia/Fibula Right  Result Date: 11/24/2020 No acute or structural abnormalities    PMFS History: There are no problems to display for this patient.  Past Medical History:  Diagnosis Date   Hypertension    mild no meds   Seasonal allergies     Family History  Problem Relation Age of Onset   Hypertension Father    Heart attack Father    Hyperlipidemia Brother  Hypertension Brother     Past Surgical History:  Procedure Laterality Date   CERVICAL CONIZATION W/BX N/A 06/03/2015   Procedure: CONIZATION CERVIX WITH BIOPSY;  Surgeon: Gerald Leitz, MD;  Location: [facility];  Service: Gynecology;  Laterality: N/A;   Social History   Occupational History   Not on file  Tobacco Use   Smoking status: Never   Smokeless tobacco: Never  Substance and Sexual Activity   Alcohol use: No    Comment: occasionally   Drug use: No   Sexual activity: Yes    Birth control/protection: Pill

## 2020-12-04 DIAGNOSIS — Z01419 Encounter for gynecological examination (general) (routine) without abnormal findings: Secondary | ICD-10-CM | POA: Diagnosis not present

## 2021-04-05 DIAGNOSIS — R0602 Shortness of breath: Secondary | ICD-10-CM | POA: Diagnosis not present

## 2021-04-05 DIAGNOSIS — R051 Acute cough: Secondary | ICD-10-CM | POA: Diagnosis not present

## 2021-04-08 ENCOUNTER — Telehealth: Payer: BC Managed Care – PPO | Admitting: Physician Assistant

## 2021-04-08 DIAGNOSIS — J4521 Mild intermittent asthma with (acute) exacerbation: Secondary | ICD-10-CM | POA: Diagnosis not present

## 2021-04-08 MED ORDER — PROMETHAZINE-DM 6.25-15 MG/5ML PO SYRP: 5.0000 mL | ORAL_SOLUTION | Freq: Four times a day (QID) | ORAL | 0 refills | Status: DC | PRN

## 2021-04-08 MED ORDER — PREDNISONE 20 MG PO TABS: 40.0000 mg | ORAL_TABLET | Freq: Every day | ORAL | 0 refills | Status: DC

## 2021-04-08 NOTE — Progress Notes (Signed)
Called left pt message to see if she would like a sooner appt.

## 2021-04-08 NOTE — Progress Notes (Signed)
Virtual Visit Consent   [name], you are scheduled for a virtual visit with a [facility] provider today.     Just as with appointments in the office, your consent must be obtained to participate.  Your consent will be active for this visit and any virtual visit you may have with one of our providers in the next 365 days.     If you have a MyChart account, a copy of this consent can be sent to you electronically.  All virtual visits are billed to your insurance company just like a traditional visit in the office.    As this is a virtual visit, video technology does not allow for your provider to perform a traditional examination.  This may limit your provider's ability to fully assess your condition.  If your provider identifies any concerns that need to be evaluated in person or the need to arrange testing (such as labs, EKG, etc.), we will make arrangements to do so.     Although advances in technology are sophisticated, we cannot ensure that it will always work on either your end or our end.  If the connection with a video visit is poor, the visit may have to be switched to a telephone visit.  With either a video or telephone visit, we are not always able to ensure that we have a secure connection.     I need to obtain your verbal consent now.   Are you willing to proceed with your visit today?    Kalise Lamoureaux has provided verbal consent on 04/08/2021 for a virtual visit (video or telephone).   Mar Daring, PA-C   Date: 04/08/2021 2:48 PM   Virtual Visit via Video Note   I, Mar Daring, connected with  Erin Meadows  ([id number], 1975-12-04) on 04/08/21 at  2:15 PM EST by a video-enabled telemedicine application and verified that I am speaking with the correct person using two identifiers.  Location: Patient: Virtual Visit Location Patient: Other: work, isolated Provider: Scientist, research (medical) Provider: Home Office   I discussed the limitations of  evaluation and management by telemedicine and the availability of in person appointments. The patient expressed understanding and agreed to proceed.    History of Present Illness: Erin Meadows is a 46 y.o. who identifies as a female who was assigned female at birth, and is being seen today for cough.  HPI: Cough This is a recurrent problem. The current episode started more than 1 year ago. The problem has been waxing and waning. The problem occurs hourly. The cough is Non-productive. Associated symptoms include chest pain (tightness). Pertinent negatives include no ear pain, fever, hemoptysis, myalgias, nasal congestion, postnasal drip, rhinorrhea or shortness of breath. The symptoms are aggravated by lying down. She has tried a beta-agonist inhaler and prescription cough suppressant (seen at [facility] on Monday. Given Albuterol and tessalon perles. Not much relief.) for the symptoms. The treatment provided no relief.     Problems: There are no problems to display for this patient.   Allergies: No Known Allergies Medications:  Current Outpatient Medications:    predniSONE (DELTASONE) 20 MG tablet, Take 2 tablets (40 mg total) by mouth daily with breakfast., Disp: 10 tablet, Rfl: 0   promethazine-dextromethorphan (PROMETHAZINE-DM) 6.25-15 MG/5ML syrup, Take 5 mLs by mouth 4 (four) times daily as needed., Disp: 118 mL, Rfl: 0   cetirizine (ZYRTEC) 10 MG tablet, Take 10 mg by mouth at bedtime., Disp: , Rfl:    cyclobenzaprine (FLEXERIL) 5  MG tablet, Take 1 tablet (5 mg total) by mouth at bedtime as needed for muscle spasms., Disp: 30 tablet, Rfl: 1   levonorgestrel-ethinyl estradiol (ALESSE) 0.1-20 MG-MCG tablet, Take 1 tablet by mouth daily., Disp: , Rfl:    triamcinolone cream (KENALOG) 0.1 %, Apply 1 application topically 2 (two) times daily., Disp: 30 g, Rfl: 0  Observations/Objective: Patient is well-developed, well-nourished in no acute distress.  Resting comfortably Head is  normocephalic, atraumatic.  No labored breathing. Speech is clear and coherent with logical content.  Patient is alert and oriented at baseline.    Assessment and Plan: 1. Mild intermittent asthmatic bronchitis with acute exacerbation - predniSONE (DELTASONE) 20 MG tablet; Take 2 tablets (40 mg total) by mouth daily with breakfast.  Dispense: 10 tablet; Refill: 0 - promethazine-dextromethorphan (PROMETHAZINE-DM) 6.25-15 MG/5ML syrup; Take 5 mLs by mouth 4 (four) times daily as needed.  Dispense: 118 mL; Refill: 0  - Suspect mild intermittent asthmatic bronchitis/reactive airway disease - Gets flares every couple of years, started in high school - Add Prednisone and Promethazine DM - Continue albuterol and tessalon - Steam treatments and humidifier can help - Push fluids - Rest as needed - Seek in person evaluation if not improving or if symptoms worsen  Follow Up Instructions: I discussed the assessment and treatment plan with the patient. The patient was provided an opportunity to ask questions and all were answered. The patient agreed with the plan and demonstrated an understanding of the instructions.  A copy of instructions were sent to the patient via MyChart unless otherwise noted below.   The patient was advised to call back or seek an in-person evaluation if the symptoms worsen or if the condition fails to improve as anticipated.  Time:  I spent 13 minutes with the patient via telehealth technology discussing the above problems/concerns.    Mar Daring, PA-C

## 2021-04-08 NOTE — Patient Instructions (Signed)
Cammie Sickle, thank you for joining Margaretann Loveless, PA-C for today's virtual visit.  While this provider is not your primary care provider (PCP), if your PCP is located in our provider database this encounter information will be shared with them immediately following your visit.  Consent: (Patient) Erin Meadows provided verbal consent for this virtual visit at the beginning of the encounter.  Current Medications:  Current Outpatient Medications:    predniSONE (DELTASONE) 20 MG tablet, Take 2 tablets (40 mg total) by mouth daily with breakfast., Disp: 10 tablet, Rfl: 0   promethazine-dextromethorphan (PROMETHAZINE-DM) 6.25-15 MG/5ML syrup, Take 5 mLs by mouth 4 (four) times daily as needed., Disp: 118 mL, Rfl: 0   cetirizine (ZYRTEC) 10 MG tablet, Take 10 mg by mouth at bedtime., Disp: , Rfl:    cyclobenzaprine (FLEXERIL) 5 MG tablet, Take 1 tablet (5 mg total) by mouth at bedtime as needed for muscle spasms., Disp: 30 tablet, Rfl: 1   levonorgestrel-ethinyl estradiol (ALESSE) 0.1-20 MG-MCG tablet, Take 1 tablet by mouth daily., Disp: , Rfl:    triamcinolone cream (KENALOG) 0.1 %, Apply 1 application topically 2 (two) times daily., Disp: 30 g, Rfl: 0   Medications ordered in this encounter:  Meds ordered this encounter  Medications   predniSONE (DELTASONE) 20 MG tablet    Sig: Take 2 tablets (40 mg total) by mouth daily with breakfast.    Dispense:  10 tablet    Refill:  0    Order Specific Question:   Supervising Provider    Answer:   Eber Hong [[id number]]   promethazine-dextromethorphan (PROMETHAZINE-DM) 6.25-15 MG/5ML syrup    Sig: Take 5 mLs by mouth 4 (four) times daily as needed.    Dispense:  118 mL    Refill:  0    Order Specific Question:   Supervising Provider    Answer:   Hyacinth Meeker, BRIAN [[id number]]     *If you need refills on other medications prior to your next appointment, please contact your pharmacy*  Follow-Up: Call back or seek an in-person evaluation if the  symptoms worsen or if the condition fails to improve as anticipated.  Other Instructions Asthma, Adult Asthma is a long-term (chronic) condition in which the airways get tight and narrow. The airways are the breathing passages that lead from the nose and mouth down into the lungs. A person with asthma will have times when symptoms get worse. These are called asthma attacks. They can cause coughing, whistling sounds when you breathe (wheezing), shortness of breath, and chest pain. They can make it hard to breathe. There is no cure for asthma, but medicines and lifestyle changes can help control it. There are many things that can bring on an asthma attack or make asthma symptoms worse (triggers). Common triggers include: Mold. Dust. Cigarette smoke. Cockroaches. Things that can cause allergy symptoms (allergens). These include animal skin flakes (dander) and pollen from trees or grass. Things that pollute the air. These may include household cleaners, wood smoke, smog, or chemical odors. Cold air, weather changes, and wind. Crying or laughing hard. Stress. Certain medicines or drugs. Certain foods such as dried fruit, potato chips, and grape juice. Infections, such as a cold or the flu. Certain medical conditions or diseases. Exercise or tiring activities. Asthma may be treated with medicines and by staying away from the things that cause asthma attacks. Types of medicines may include: Controller medicines. These help prevent asthma symptoms. They are usually taken every day. Fast-acting reliever or rescue medicines. These  quickly relieve asthma symptoms. They are used as needed and provide short-term relief. Allergy medicines if your attacks are brought on by allergens. Medicines to help control the body's defense (immune) system. Follow these instructions at home: Avoiding triggers in your home Change your heating and air conditioning filter often. Limit your use of fireplaces and wood  stoves. Get rid of pests (such as roaches and mice) and their droppings. Throw away plants if you see mold on them. Clean your floors. Dust regularly. Use cleaning products that do not smell. Have someone vacuum when you are not home. Use a vacuum cleaner with a HEPA filter if possible. Replace carpet with wood, tile, or vinyl flooring. Carpet can trap animal skin flakes and dust. Use allergy-proof pillows, mattress covers, and box spring covers. Wash bed sheets and blankets every week in hot water. Dry them in a dryer. Keep your bedroom free of any triggers. Avoid pets and keep windows closed when things that cause allergy symptoms are in the air. Use blankets that are made of polyester or cotton. Clean bathrooms and kitchens with bleach. If possible, have someone repaint the walls in these rooms with mold-resistant paint. Keep out of the rooms that are being cleaned and painted. Wash your hands often with soap and water. If soap and water are not available, use hand sanitizer. Do not allow anyone to smoke in your home. General instructions Take over-the-counter and prescription medicines only as told by your doctor. Talk with your doctor if you have questions about how or when to take your medicines. Make note if you need to use your medicines more often than usual. Do not use any products that contain nicotine or tobacco, such as cigarettes and e-cigarettes. If you need help quitting, ask your doctor. Stay away from secondhand smoke. Avoid doing things outdoors when allergen counts are high and when air quality is low. Wear a ski mask when doing outdoor activities in the winter. The mask should cover your nose and mouth. Exercise indoors on cold days if you can. Warm up before you exercise. Take time to cool down after exercise. Use a peak flow meter as told by your doctor. A peak flow meter is a tool that measures how well the lungs are working. Keep track of the peak flow meter's  readings. Write them down. Follow your asthma action plan. This is a written plan for taking care of your asthma and treating your attacks. Make sure you get all the shots (vaccines) that your doctor recommends. Ask your doctor about a flu shot and a pneumonia shot. Keep all follow-up visits as told by your doctor. This is important. Contact a doctor if: You have wheezing, shortness of breath, or a cough even while taking medicine to prevent attacks. The mucus you cough up (sputum) is thicker than usual. The mucus you cough up changes from clear or white to yellow, green, gray, or bloody. You have problems from the medicine you are taking, such as: A rash. Itching. Swelling. Trouble breathing. You need reliever medicines more than 2-3 times a week. Your peak flow reading is still at 50-79% of your personal best after following the action plan for 1 hour. You have a fever. Get help right away if: You seem to be worse and are not responding to medicine during an asthma attack. You are short of breath even at rest. You get short of breath when doing very little activity. You have trouble eating, drinking, or talking. You have chest  pain or tightness. You have a fast heartbeat. Your lips or fingernails start to turn blue. You are light-headed or dizzy, or you faint. Your peak flow is less than 50% of your personal best. You feel too tired to breathe normally. Summary Asthma is a long-term (chronic) condition in which the airways get tight and narrow. An asthma attack can make it hard to breathe. Asthma cannot be cured, but medicines and lifestyle changes can help control it. Make sure you understand how to avoid triggers and how and when to use your medicines. This information is not intended to replace advice given to you by your health care provider. Make sure you discuss any questions you have with your health care provider. Document Revised: 05/26/2019 Document Reviewed:  06/05/2019 Elsevier Patient Education  2022 [employer].    If you have been instructed to have an in-person evaluation today at a local Urgent Care facility, please use the link below. It will take you to a list of all of our available [facility], including address, phone number and hours of operation. Please do not delay care.  [facility]  If you or a family member do not have a primary care provider, use the link below to schedule a visit and establish care. When you choose a [facility] primary care physician or advanced practice provider, you gain a long-term partner in health. Find a Primary Care Provider  Learn more about [facility] in-office and virtual care options: [facility] - Get Care Now

## 2021-04-12 ENCOUNTER — Other Ambulatory Visit: Payer: Self-pay

## 2021-04-12 ENCOUNTER — Encounter: Payer: Self-pay | Admitting: Registered Nurse

## 2021-04-12 ENCOUNTER — Ambulatory Visit (INDEPENDENT_AMBULATORY_CARE_PROVIDER_SITE_OTHER): Payer: BC Managed Care – PPO | Admitting: Registered Nurse

## 2021-04-12 VITALS — BP 142/81 | HR 79 | Temp 98.2°F | Resp 18 | Ht 63.0 in | Wt 183.0 lb

## 2021-04-12 DIAGNOSIS — R053 Chronic cough: Secondary | ICD-10-CM | POA: Diagnosis not present

## 2021-04-12 MED ORDER — FLUTICASONE-SALMETEROL 250-50 MCG/ACT IN AEPB: 1.0000 | INHALATION_SPRAY | Freq: Two times a day (BID) | RESPIRATORY_TRACT | 11 refills | Status: DC

## 2021-04-12 MED ORDER — HYDROCODONE BIT-HOMATROP MBR 5-1.5 MG/5ML PO SOLN: 5.0000 mL | Freq: Three times a day (TID) | ORAL | 0 refills | Status: DC | PRN

## 2021-04-12 NOTE — Progress Notes (Signed)
Established Patient Office Visit  Subjective:  Patient ID: Erin Meadows, female    DOB: 11/07/1975  Age: 46 y.o. MRN: [id number]  CC:  Chief Complaint  Patient presents with   Cough    Patient states she has been having a chronic cough for years and it has been getting worse again. Patient states that the cough is worse at night. Patient has been seen twice for this issue     HPI Erin Meadows presents for cough  On and off since high school  Had been on codeine cough syrup in the past with good effect.   Recently seen at [facility] and via [facility] and given albuterol, tessalon, promethazine, prednisone. No real effect felt.   Continues to take zyrtec daily.   Originally from [place], moved down here 15 years ago - cough has been generally worse since.   Past Medical History:  Diagnosis Date   Hypertension    mild no meds   Seasonal allergies     Past Surgical History:  Procedure Laterality Date   CERVICAL CONIZATION W/BX N/A 06/03/2015   Procedure: CONIZATION CERVIX WITH BIOPSY;  Surgeon: Gerald Leitz, MD;  Location: [facility];  Service: Gynecology;  Laterality: N/A;    Family History  Problem Relation Age of Onset   Hypertension Father    Heart attack Father    Hyperlipidemia Brother    Hypertension Brother     Social History   Socioeconomic History   Marital status: Divorced    Spouse name: Not on file   Number of children: Not on file   Years of education: Not on file   Highest education level: Not on file  Occupational History   Not on file  Tobacco Use   Smoking status: Never   Smokeless tobacco: Never  Substance and Sexual Activity   Alcohol use: No    Comment: occasionally   Drug use: No   Sexual activity: Yes    Birth control/protection: Pill  Other Topics Concern   Not on file  Social History Narrative   Not on file   Social Determinants of Health   Financial Resource Strain: Not on file  Food Insecurity: Not on file  Transportation  Needs: Not on file  Physical Activity: Not on file  Stress: Not on file  Social Connections: Not on file  Intimate Partner Violence: Not on file    Outpatient Medications Prior to Visit  Medication Sig Dispense Refill   cetirizine (ZYRTEC) 10 MG tablet Take 10 mg by mouth at bedtime.     cyclobenzaprine (FLEXERIL) 5 MG tablet Take 1 tablet (5 mg total) by mouth at bedtime as needed for muscle spasms. 30 tablet 1   levonorgestrel-ethinyl estradiol (ALESSE) 0.1-20 MG-MCG tablet Take 1 tablet by mouth daily.     predniSONE (DELTASONE) 20 MG tablet Take 2 tablets (40 mg total) by mouth daily with breakfast. 10 tablet 0   promethazine-dextromethorphan (PROMETHAZINE-DM) 6.25-15 MG/5ML syrup Take 5 mLs by mouth 4 (four) times daily as needed. 118 mL 0   triamcinolone cream (KENALOG) 0.1 % Apply 1 application topically 2 (two) times daily. 30 g 0   No facility-administered medications prior to visit.    No Known Allergies  ROS Review of Systems  Constitutional: Negative.   HENT: Negative.    Eyes: Negative.   Respiratory:  Positive for cough and wheezing. Negative for apnea, choking, chest tightness, shortness of breath and stridor.   Cardiovascular: Negative.   Gastrointestinal: Negative.  Genitourinary: Negative.   Musculoskeletal: Negative.   Skin: Negative.   Neurological: Negative.   Psychiatric/Behavioral: Negative.    All other systems reviewed and are negative.    Objective:    Physical Exam Vitals and nursing note reviewed.  Constitutional:      General: She is not in acute distress.    Appearance: Normal appearance. She is normal weight. She is not ill-appearing, toxic-appearing or diaphoretic.  Cardiovascular:     Rate and Rhythm: Normal rate and regular rhythm.     Heart sounds: Normal heart sounds. No murmur heard.   No friction rub. No gallop.  Pulmonary:     Effort: Pulmonary effort is normal. No respiratory distress.     Breath sounds: Normal breath sounds.  No stridor. No wheezing, rhonchi or rales.  Chest:     Chest wall: No tenderness.  Skin:    General: Skin is warm and dry.  Neurological:     General: No focal deficit present.     Mental Status: She is alert and oriented to person, place, and time. Mental status is at baseline.  Psychiatric:        Mood and Affect: Mood normal.        Behavior: Behavior normal.        Thought Content: Thought content normal.        Judgment: Judgment normal.    BP (!) 142/81    Pulse 79    Temp 98.2 F (36.8 C) (Temporal)    Resp 18    Ht 5\' 3"  (1.6 m)    Wt 183 lb (83 kg)    SpO2 100%    BMI 32.42 kg/m  Wt Readings from Last 3 Encounters:  04/12/21 183 lb (83 kg)  11/24/20 181 lb (82.1 kg)  11/10/20 181 lb 6.4 oz (82.3 kg)     Health Maintenance Due  Topic Date Due   COVID-19 Vaccine (3 - Booster for Pfizer series) 07/02/2019    There are no preventive care reminders to display for this patient.  Lab Results  Component Value Date   TSH 1.094 06/11/2014   Lab Results  Component Value Date   WBC 8.5 07/28/2020   HGB 15.7 (H) 07/28/2020   HCT 46.5 (H) 07/28/2020   MCV 92.6 07/28/2020   PLT 339.0 07/28/2020   Lab Results  Component Value Date   NA 138 07/28/2020   K 4.3 07/28/2020   CO2 24 07/28/2020   GLUCOSE 126 (H) 07/28/2020   BUN 10 07/28/2020   CREATININE 0.66 07/28/2020   BILITOT 0.4 07/28/2020   ALKPHOS 80 07/28/2020   AST 13 07/28/2020   ALT 14 07/28/2020   PROT 7.7 07/28/2020   ALBUMIN 4.8 07/28/2020   CALCIUM 9.9 07/28/2020   GFR 108.14 07/28/2020   No results found for: CHOL No results found for: HDL No results found for: LDLCALC No results found for: TRIG No results found for: CHOLHDL No results found for: 07/30/2020    Assessment & Plan:   Problem List Items Addressed This Visit   None Visit Diagnoses     Chronic cough    -  Primary   Relevant Medications   HYDROcodone bit-homatropine (HYCODAN) 5-1.5 MG/5ML syrup   fluticasone-salmeterol (ADVAIR  DISKUS) 250-50 MCG/ACT AEPB       Meds ordered this encounter  Medications   HYDROcodone bit-homatropine (HYCODAN) 5-1.5 MG/5ML syrup    Sig: Take 5 mLs by mouth every 8 (eight) hours as needed for cough.  Dispense:  120 mL    Refill:  0    Order Specific Question:   Supervising Provider    Answer:   Neva Seat, JEFFREY R [[id number]]   fluticasone-salmeterol (ADVAIR DISKUS) 250-50 MCG/ACT AEPB    Sig: Inhale 1 puff into the lungs in the morning and at bedtime.    Dispense:  60 each    Refill:  11    Order Specific Question:   Supervising Provider    Answer:   Neva Seat, JEFFREY R [[id number]]    Follow-up: Return if symptoms worsen or fail to improve.   PLAN Hycodan for prn use. Reviewed risks. Start advair diskus bid Return if worsening or failing to improve. Can consider allergy vs. Pulmonary referral Patient encouraged to call clinic with any questions, comments, or concerns.  Janeece Agee, NP

## 2021-04-12 NOTE — Patient Instructions (Addendum)
Ms. Yano -  Randie Heinz to see you  Start advair diskus one puff twice daily  Monitor effect  Can use hycodan as needed  Thanks,  Rich     If you have lab work done today you will be contacted with your lab results within the next 2 weeks.  If you have not heard from [place] then please contact us. The fastest way to get your results is to register for My Chart.   IF you received an x-ray today, you will receive an invoice from [facility]. Please contact [facility] at [phone] with questions or concerns regarding your invoice.   IF you received labwork today, you will receive an invoice from [facility]. Please contact [facility] at [phone] with questions or concerns regarding your invoice.   Our billing staff will not be able to assist you with questions regarding bills from these companies.  You will be contacted with the lab results as soon as they are available. The fastest way to get your results is to activate your My Chart account. Instructions are located on the last page of this paperwork. If you have not heard from [place] regarding the results in 2 weeks, please contact this office.

## 2021-07-22 DIAGNOSIS — H66002 Acute suppurative otitis media without spontaneous rupture of ear drum, left ear: Secondary | ICD-10-CM | POA: Diagnosis not present

## 2021-07-22 DIAGNOSIS — J0100 Acute maxillary sinusitis, unspecified: Secondary | ICD-10-CM | POA: Diagnosis not present

## 2021-08-05 DIAGNOSIS — H6505 Acute serous otitis media, recurrent, left ear: Secondary | ICD-10-CM | POA: Diagnosis not present

## 2021-08-16 ENCOUNTER — Other Ambulatory Visit: Payer: Self-pay | Admitting: Obstetrics and Gynecology

## 2021-08-18 DIAGNOSIS — N644 Mastodynia: Secondary | ICD-10-CM | POA: Diagnosis not present

## 2021-08-19 ENCOUNTER — Other Ambulatory Visit: Payer: Self-pay | Admitting: Nurse Practitioner

## 2021-08-19 DIAGNOSIS — N644 Mastodynia: Secondary | ICD-10-CM

## 2021-08-20 DIAGNOSIS — N644 Mastodynia: Secondary | ICD-10-CM | POA: Diagnosis not present

## 2021-08-23 DIAGNOSIS — Z6827 Body mass index (BMI) 27.0-27.9, adult: Secondary | ICD-10-CM | POA: Diagnosis not present

## 2021-08-23 DIAGNOSIS — H66002 Acute suppurative otitis media without spontaneous rupture of ear drum, left ear: Secondary | ICD-10-CM | POA: Diagnosis not present

## 2021-08-23 DIAGNOSIS — R0981 Nasal congestion: Secondary | ICD-10-CM | POA: Diagnosis not present

## 2021-08-24 ENCOUNTER — Ambulatory Visit
Admission: RE | Admit: 2021-08-24 | Discharge: 2021-08-24 | Disposition: A | Discharge status: Home / Self Care | Payer: BC Managed Care – PPO | Source: Ambulatory Visit | Attending: Nurse Practitioner | Admitting: Nurse Practitioner

## 2021-08-24 ENCOUNTER — Ambulatory Visit: Payer: BC Managed Care – PPO

## 2021-08-24 DIAGNOSIS — N644 Mastodynia: Secondary | ICD-10-CM | POA: Diagnosis not present

## 2021-08-27 ENCOUNTER — Ambulatory Visit
Admission: RE | Admit: 2021-08-27 | Discharge: 2021-08-27 | Disposition: A | Discharge status: Home / Self Care | Payer: BC Managed Care – PPO | Source: Ambulatory Visit

## 2021-08-27 VITALS — BP 150/89 | HR 89 | Temp 97.9°F | Resp 18

## 2021-08-27 DIAGNOSIS — H6983 Other specified disorders of Eustachian tube, bilateral: Secondary | ICD-10-CM

## 2021-08-27 DIAGNOSIS — H6593 Unspecified nonsuppurative otitis media, bilateral: Secondary | ICD-10-CM | POA: Diagnosis not present

## 2021-08-27 DIAGNOSIS — H6993 Unspecified Eustachian tube disorder, bilateral: Secondary | ICD-10-CM

## 2021-08-27 DIAGNOSIS — J309 Allergic rhinitis, unspecified: Secondary | ICD-10-CM

## 2021-08-27 MED ORDER — IPRATROPIUM BROMIDE 0.06 % NA SOLN: 2.0000 | Freq: Three times a day (TID) | NASAL | 2 refills | Status: AC

## 2021-08-27 MED ORDER — FLUCONAZOLE 150 MG PO TABS: ORAL_TABLET | ORAL | 5 refills | Status: AC

## 2021-08-27 MED ORDER — FLUOCINOLONE ACETONIDE 0.01 % OT OIL: 5.0000 [drp] | TOPICAL_OIL | Freq: Two times a day (BID) | OTIC | 2 refills | Status: AC | PRN

## 2021-08-27 MED ORDER — CETIRIZINE HCL 10 MG PO TABS: 10.0000 mg | ORAL_TABLET | Freq: Every day | ORAL | 1 refills | Status: AC

## 2021-08-27 MED ORDER — MONTELUKAST SODIUM 10 MG PO TABS: 10.0000 mg | ORAL_TABLET | Freq: Every day | ORAL | 5 refills | Status: AC

## 2021-08-27 MED ORDER — MOMETASONE FUROATE 50 MCG/ACT NA SUSP: 2.0000 | Freq: Every day | NASAL | 5 refills | Status: AC

## 2021-08-27 NOTE — ED Triage Notes (Signed)
Pt c/o left ear pain that has been ongoing for over a month. The patient states she has been on 3 abx and still has no relief.   Home interventions: none

## 2021-08-27 NOTE — ED Provider Notes (Signed)
[facility]    CSN: [id number] Arrival date & time: 08/27/21  2130    HISTORY   Chief Complaint  Patient presents with   Ear Injury    Ear pain and have taken three different antibiotics over the course of a month. - Entered by patient   Otalgia   HPI Erin Meadows is a pleasant, 46 y.o. female who presents to urgent care today complaining of Left ear pain that has been ongoing for over a month.  Patient has been to [facility] for this several times, first visit wasJune 8 during which she was prescribed a 10-day course of amoxicillin and Flonase for nonrecurrent acute suppurative otitis media without spontaneous rupture of membrane and acute nonrecurrent maxillary sinusitis.  Patient states she took all as prescribed but noticed that her symptoms returned as before so she went back [facility] where she was diagnosed with recurrent otitis media in her left ear and provided with a 10-day course of cefdinir.  On July 10, [facility] where she was again diagnosed with acute suppurative otitis media as well as nasal sinus congestion and provided with a prescription for clindamycin and a tapering dose of methylprednisolone.  Patient states she is day 4 on this most recent treatment and continues to have similar symptoms.  Patient reports a history of allergic rhinitis and eustachian tube dysfunction, states she does take Zyrtec every day and when she does that her symptoms do get worse.  Patient states her son takes Singulair.  Patient states she is also been scribed Advair in the past for a dysfunctional cough that will not go away, states she has 6 inhalers at home, not currently using because she is not coughing.  The history is provided by the patient.   Past Medical History:  Diagnosis Date   Hypertension    mild no meds   Seasonal allergies    There are no problems to display for this patient.  Past Surgical History:  Procedure  Laterality Date   CERVICAL CONIZATION W/BX N/A 06/03/2015   Procedure: CONIZATION CERVIX WITH BIOPSY;  Surgeon: Gerald Leitz, MD;  Location: [facility];  Service: Gynecology;  Laterality: N/A;   OB History   No obstetric history on file.    Home Medications    Prior to Admission medications   Medication Sig Start Date End Date Taking? Authorizing Provider  cetirizine (ZYRTEC) 10 MG tablet Take 10 mg by mouth at bedtime.    [provider]  clindamycin (CLEOCIN) 300 MG capsule Take 300 mg by mouth 3 (three) times daily. 08/23/21   [provider]  levonorgestrel-ethinyl estradiol (ALESSE) 0.1-20 MG-MCG tablet Take 1 tablet by mouth daily.    [provider]  methylPREDNISolone (MEDROL DOSEPAK) 4 MG TBPK tablet Take by mouth. 08/23/21   [provider]    Family History Family History  Problem Relation Age of Onset   Hypertension Father    Heart attack Father    Hyperlipidemia Brother    Hypertension Brother    Social History Social History   Tobacco Use   Smoking status: Never   Smokeless tobacco: Never  Substance Use Topics   Alcohol use: No    Comment: occasionally   Drug use: No   Allergies   Patient has no known allergies.  Review of Systems Review of Systems Pertinent findings revealed after performing a 14 point review of systems has been noted in the history of  present illness.  Physical Exam Triage Vital Signs ED Triage Vitals  Enc Vitals Group     BP 12/11/20 0827 (!) 147/82     Pulse Rate 12/11/20 0827 72     Resp 12/11/20 0827 18     Temp 12/11/20 0827 98.3 F (36.8 C)     Temp Source 12/11/20 0827 Oral     SpO2 12/11/20 0827 98 %     Weight --      Height --      Head Circumference --      Peak Flow --      Pain Score 12/11/20 0826 5     Pain Loc --      Pain Edu? --      Excl. in GC? --   No data found.  Updated Vital Signs BP (!) 150/89 (BP Location: Right Arm)   Pulse 89   Temp 97.9 F (36.6 C) (Oral)   Resp  18   LMP 07/07/2021 Comment: patient states her menstrual did not occur in june but she does take birth control  SpO2 95%   Physical Exam Vitals and nursing note reviewed.  Constitutional:      General: She is not in acute distress.    Appearance: Normal appearance. She is not ill-appearing.  HENT:     Head: Normocephalic and atraumatic.     Salivary Glands: Right salivary gland is not diffusely enlarged or tender. Left salivary gland is not diffusely enlarged or tender.     Right Ear: Hearing, ear canal and external ear normal. No drainage. No middle ear effusion. There is no impacted cerumen. Tympanic membrane is bulging. Tympanic membrane is not injected or erythematous.     Left Ear: Hearing, ear canal and external ear normal. No drainage.  No middle ear effusion. There is no impacted cerumen. Tympanic membrane is bulging. Tympanic membrane is not injected or erythematous.     Ears:     Comments: Bilateral EACs diffusely erythematous, mildly edematous, clear of wax and debris, both TMs bulging without injection or erythema, fluid behind eardrums is slightly milky in appearance.    Nose: Rhinorrhea present. No nasal deformity, septal deviation, signs of injury, nasal tenderness, mucosal edema or congestion. Rhinorrhea is clear.     Right Nostril: Occlusion present. No foreign body, epistaxis or septal hematoma.     Left Nostril: Occlusion present. No foreign body, epistaxis or septal hematoma.     Right Turbinates: Enlarged, swollen and pale.     Left Turbinates: Enlarged, swollen and pale.     Right Sinus: No maxillary sinus tenderness or frontal sinus tenderness.     Left Sinus: No maxillary sinus tenderness or frontal sinus tenderness.     Mouth/Throat:     Lips: Pink. No lesions.     Mouth: Mucous membranes are moist. No oral lesions.     Pharynx: Oropharynx is clear. Uvula midline. No posterior oropharyngeal erythema or uvula swelling.     Tonsils: No tonsillar exudate. 0 on the  right. 0 on the left.     Comments: Postnasal drip Eyes:     General: Lids are normal.        Right eye: No discharge.        Left eye: No discharge.     Extraocular Movements: Extraocular movements intact.     Conjunctiva/sclera: Conjunctivae normal.     Right eye: Right conjunctiva is not injected.     Left eye: Left conjunctiva is not injected.  Neck:     Trachea: Trachea and phonation normal.  Cardiovascular:     Rate and Rhythm: Normal rate and regular rhythm.     Pulses: Normal pulses.     Heart sounds: Normal heart sounds. No murmur heard.    No friction rub. No gallop.  Pulmonary:     Effort: Pulmonary effort is normal. No accessory muscle usage, prolonged expiration or respiratory distress.     Breath sounds: Normal breath sounds. No stridor, decreased air movement or transmitted upper airway sounds. No decreased breath sounds, wheezing, rhonchi or rales.  Chest:     Chest wall: No tenderness.  Musculoskeletal:        General: Normal range of motion.     Cervical back: Normal range of motion and neck supple. Normal range of motion.  Lymphadenopathy:     Cervical: No cervical adenopathy.  Skin:    General: Skin is warm and dry.     Findings: No erythema or rash.  Neurological:     General: No focal deficit present.     Mental Status: She is alert and oriented to person, place, and time.  Psychiatric:        Mood and Affect: Mood normal.        Behavior: Behavior normal.     Visual Acuity Right Eye Distance:   Left Eye Distance:   Bilateral Distance:    Right Eye Near:   Left Eye Near:    Bilateral Near:     [facility] Couse / Diagnostics / Procedures:     Radiology No results found.  Procedures Procedures (including critical care time) EKG  Pending results:  Labs Reviewed - No data to display  Medications Ordered in UC: Medications - No data to display  UC Diagnoses / Final Clinical Impressions(s)   I have reviewed the triage vital signs and the  nursing notes.  Pertinent labs & imaging results that were available during my care of the patient were reviewed by me and considered in my medical decision making (see chart for details).    Final diagnoses:  Allergic rhinitis, unspecified seasonality, unspecified trigger  Eustachian tube dysfunction, bilateral  Bilateral non-suppurative otitis media   Patient advised to complete full 10-day course of clindamycin given that she is 4 days into it at this point and fluid behind eardrums is not entirely clear.  Diflucan sent at patient's request due to patient having to deal with vaginal yeast infection secondary to antibiotic use this time.  Patient advised to continue Zyrtec, begin Singulair and a nasal steroid, discussed the differences between Flonase and Nasonex.  Patient also advised to begin fluocinolone acetamide oil in both ears to relieve erythema and mild edema which could potentially be the cause of her pain at this time.  Patient advised to follow-up with PCP, consider referral to ENT and/or allergy/immunology for further evaluation of her upper respiratory allergies and inflammation.  ED Prescriptions     Medication Sig Dispense Auth. Provider   cetirizine (ZYRTEC ALLERGY) 10 MG tablet Take 1 tablet (10 mg total) by mouth at bedtime. 90 tablet Theadora Rama Scales, PA-C   montelukast (SINGULAIR) 10 MG tablet Take 1 tablet (10 mg total) by mouth at bedtime. 30 tablet Theadora Rama Scales, PA-C   mometasone (NASONEX) 50 MCG/ACT nasal spray Place 2 sprays into the nose daily. 17 g Theadora Rama Scales, PA-C   ipratropium (ATROVENT) 0.06 % nasal spray Place 2 sprays into both nostrils 3 (three) times daily. As needed for  nasal congestion, runny nose 15 mL Theadora Rama Scales, PA-C   Fluocinolone Acetonide (DERMOTIC) 0.01 % OIL Place 5 drops in ear(s) 2 (two) times daily as needed (Itching in ears). 20 mL Theadora Rama Scales, PA-C   fluconazole (DIFLUCAN) 150 MG tablet Take 1  tablet today.  Take second tablet 3 days later. 2 tablet Theadora Rama Scales, PA-C      PDMP not reviewed this encounter.  Disposition Upon Discharge:  Condition: stable for discharge home Home: take medications as prescribed; routine discharge instructions as discussed; follow up as advised.  Patient presented with an acute illness with associated systemic symptoms and significant discomfort requiring urgent management. In my opinion, this is a condition that a prudent lay person (someone who possesses an average knowledge of health and medicine) may potentially expect to result in complications if not addressed urgently such as respiratory distress, impairment of bodily function or dysfunction of bodily organs.   Routine symptom specific, illness specific and/or disease specific instructions were discussed with the patient and/or caregiver at length.   As such, the patient has been evaluated and assessed, work-up was performed and treatment was provided in alignment with urgent care protocols and evidence based medicine.  Patient/parent/caregiver has been advised that the patient may require follow up for further testing and treatment if the symptoms continue in spite of treatment, as clinically indicated and appropriate.  If the patient was tested for COVID-19, Influenza and/or RSV, then the patient/parent/guardian was advised to isolate at home pending the results of his/her diagnostic coronavirus test and potentially longer if they're positive. I have also advised pt that if his/her COVID-19 test returns positive, it's recommended to self-isolate for at least 10 days after symptoms first appeared AND until fever-free for 24 hours without fever reducer AND other symptoms have improved or resolved. Discussed self-isolation recommendations as well as instructions for household member/close contacts as per the [facility] and [place] DHHS, and also gave patient the COVID packet with this  information.  Patient/parent/caregiver has been advised to return to the [facility] or PCP in 3-5 days if no better; to PCP or the Emergency Department if new signs and symptoms develop, or if the current signs or symptoms continue to change or worsen for further workup, evaluation and treatment as clinically indicated and appropriate  The patient will follow up with their current PCP if and as advised. If the patient does not currently have a PCP we will assist them in obtaining one.   The patient may need specialty follow up if the symptoms continue, in spite of conservative treatment and management, for further workup, evaluation, consultation and treatment as clinically indicated and appropriate.  Patient/parent/caregiver verbalized understanding and agreement of plan as discussed.  All questions were addressed during visit.  Please see discharge instructions below for further details of plan.  Discharge Instructions:   Discharge Instructions      Your symptoms and my physical exam findings are concerning for incompletely controlled allergies.  Please see the list below for recommended medications, dosages and frequencies to provide relief of current symptoms:     Zyrtec (cetirizine): Please continue this is an excellent second-generation antihistamine that helps to reduce respiratory inflammatory response to environmental allergens.  In some patients, this medication can cause daytime sleepiness so I recommend that you take 1 tablet daily at bedtime.     Singulair (montelukast): This is a mast cell stabilizer that works well with antihistamines.  Mast cells are responsible for stimulating histamine production so  you can imagine that if we can reduce the activity of your mast cells, then fewer histamines will be produced and inflammation caused by allergy exposure will be significantly reduced.  I recommend that you take this medication at the same time you take your antihistamine for the next few  weeks to see if we can get your upper airway inflammation settled.  I provided you with a 30-day prescription but you are welcome to refill it as needed.   Nasonex (mometasone): This is a steroid nasal spray that you use once daily, 2 sprays in each nare.  This medication does not work well if you decide to use it only used as you feel you need to, it works best used on a daily basis.  After 3 to 5 days of use, you will notice significant reduction of the inflammation and mucus production that is currently being caused by exposure to allergens, whether seasonal or environmental.  The most common side effect of this medication is nosebleeds.  If you experience a nosebleed, please discontinue use for 1 week, then feel free to resume.  I have provided you with a prescription.  As we discussed, Flonase is great entry-level nasal steroid but if you find that Flonase is not working after 1 week of consistent use, you can try Nasonex instead.  I have found good prices for Nasonex on [employer] if you are unable to find more affordable elsewhere.   Atrovent (ipratropium): This is an excellent nasal decongestant spray I have added to your recommended nasal steroid that will not cause rebound congestion, please instill 2 sprays into each nare with each use.  Because nasal steroids can take several days before they begin to provide full benefit, I recommend that you use this spray in addition to the nasal steroid prescribed for you.  Please use it after you have used your nasal steroid and repeat up to 3 times daily as needed.  You do not need to use this every day, only when your symptoms get worse..  I have provided you with a prescription for this medication.      DermOtic oil (fluocinolone oil): This is a topical steroid that you can use in your ear to relieve inflammation which could potentially be causing some of your pain as well.  Both of your ear canals were fairly red appearing on exam.  The nerves in your ears  are so sensitive, sometimes is difficult to tell whether your ear canals are hurting or your eardrums are hurting.  Please instill 3 to 4 drops in each ear twice daily initially.  If this is going to help, you will notice in 2 to 3 days.  Once it starts to help, you can decrease to the frequency that you desire.  Some people only use this drop once a week.  If it is not helping at all, you can discontinue.  Advil, Motrin (ibuprofen): This is a good anti-inflammatory medication which not only addresses aches, pains but also significantly reduces soft tissue inflammation of the upper airways that causes sinus and nasal congestion as well as inflammation of the lower airways which makes you feel like your breathing is constricted or your cough feel tight.  I recommend that you take between 400 mg every 8 hours as needed for flareups.      If your insurance will not cover your allergy medications, please consider downloading the Good Rx app which is free.  You can find considerable discounts on prescription and  over-the-counter medications.   If you find that you have not had significant relief of your symptoms in the next 7 to 10 days, please follow-up with your primary care provider or return here to urgent care for repeat evaluation and further recommendations.   Thank you for visiting urgent care today.  We appreciate the opportunity to participate in your care.       This office note has been dictated using Teaching laboratory technician.  Unfortunately, this method of dictation can sometimes lead to typographical or grammatical errors.  I apologize for your inconvenience in advance if this occurs.  Please do not hesitate to reach out to me if clarification is needed.      Theadora Rama Scales, PA-C 08/27/21 1220

## 2021-08-27 NOTE — Discharge Instructions (Addendum)
Your symptoms and my physical exam findings are concerning for incompletely controlled allergies.  Please see the list below for recommended medications, dosages and frequencies to provide relief of current symptoms:     Zyrtec (cetirizine): Please continue this is an excellent second-generation antihistamine that helps to reduce respiratory inflammatory response to environmental allergens.  In some patients, this medication can cause daytime sleepiness so I recommend that you take 1 tablet daily at bedtime.     Singulair (montelukast): This is a mast cell stabilizer that works well with antihistamines.  Mast cells are responsible for stimulating histamine production so you can imagine that if we can reduce the activity of your mast cells, then fewer histamines will be produced and inflammation caused by allergy exposure will be significantly reduced.  I recommend that you take this medication at the same time you take your antihistamine for the next few weeks to see if we can get your upper airway inflammation settled.  I provided you with a 30-day prescription but you are welcome to refill it as needed.   Nasonex (mometasone): This is a steroid nasal spray that you use once daily, 2 sprays in each nare.  This medication does not work well if you decide to use it only used as you feel you need to, it works best used on a daily basis.  After 3 to 5 days of use, you will notice significant reduction of the inflammation and mucus production that is currently being caused by exposure to allergens, whether seasonal or environmental.  The most common side effect of this medication is nosebleeds.  If you experience a nosebleed, please discontinue use for 1 week, then feel free to resume.  I have provided you with a prescription.  As we discussed, Flonase is great entry-level nasal steroid but if you find that Flonase is not working after 1 week of consistent use, you can try Nasonex instead.  I have found good  prices for Nasonex on [employer] if you are unable to find more affordable elsewhere.   Atrovent (ipratropium): This is an excellent nasal decongestant spray I have added to your recommended nasal steroid that will not cause rebound congestion, please instill 2 sprays into each nare with each use.  Because nasal steroids can take several days before they begin to provide full benefit, I recommend that you use this spray in addition to the nasal steroid prescribed for you.  Please use it after you have used your nasal steroid and repeat up to 3 times daily as needed.  You do not need to use this every day, only when your symptoms get worse..  I have provided you with a prescription for this medication.      DermOtic oil (fluocinolone oil): This is a topical steroid that you can use in your ear to relieve inflammation which could potentially be causing some of your pain as well.  Both of your ear canals were fairly red appearing on exam.  The nerves in your ears are so sensitive, sometimes is difficult to tell whether your ear canals are hurting or your eardrums are hurting.  Please instill 3 to 4 drops in each ear twice daily initially.  If this is going to help, you will notice in 2 to 3 days.  Once it starts to help, you can decrease to the frequency that you desire.  Some people only use this drop once a week.  If it is not helping at all, you can discontinue.  Advil,  Motrin (ibuprofen): This is a good anti-inflammatory medication which not only addresses aches, pains but also significantly reduces soft tissue inflammation of the upper airways that causes sinus and nasal congestion as well as inflammation of the lower airways which makes you feel like your breathing is constricted or your cough feel tight.  I recommend that you take between 400 mg every 8 hours as needed for flareups.      If your insurance will not cover your allergy medications, please consider downloading the Good Rx app which is free.   You can find considerable discounts on prescription and over-the-counter medications.   If you find that you have not had significant relief of your symptoms in the next 7 to 10 days, please follow-up with your primary care provider or return here to urgent care for repeat evaluation and further recommendations.   Thank you for visiting urgent care today.  We appreciate the opportunity to participate in your care.

## 2021-09-09 ENCOUNTER — Telehealth: Payer: Self-pay | Admitting: Registered Nurse

## 2021-09-09 ENCOUNTER — Other Ambulatory Visit: Payer: Self-pay

## 2021-09-09 DIAGNOSIS — H9190 Unspecified hearing loss, unspecified ear: Secondary | ICD-10-CM

## 2021-09-09 NOTE — Telephone Encounter (Signed)
Audiologist referral placed and sent to Richard to [employer]

## 2021-09-09 NOTE — Telephone Encounter (Signed)
Yes if you could place, I will cosign Decreased hearing is ok dx  Thanks  Rich

## 2021-09-09 NOTE — Progress Notes (Signed)
Audiologist referral placed

## 2021-09-09 NOTE — Telephone Encounter (Signed)
Caller name: Selena Batten (pt)  On DPR? :yes/no: Yes  Call back number:  [phone]  Provider they see: Janeece Agee  Reason for call: Pt calling to f/u on referral to audiologist.

## 2021-09-14 ENCOUNTER — Ambulatory Visit: Payer: BC Managed Care – PPO | Attending: Audiology | Admitting: Audiology

## 2021-09-14 DIAGNOSIS — H9202 Otalgia, left ear: Secondary | ICD-10-CM | POA: Insufficient documentation

## 2021-09-14 NOTE — Procedures (Signed)
  Outpatient Audiology and [facility] [address] [place], [place]  [address] [phone]  AUDIOLOGICAL  EVALUATION  NAME: Erin Meadows     DOB:   09-04-1975      MRN: [id number]                                                                                     DATE: 09/14/2021    REFERENT: Janeece Agee, NP STATUS: Outpatient DIAGNOSIS: Otalgia, left ear   History: Erin Meadows was seen for an audiological evaluation due to left otalgia occurring for approximately 2 months. Erin Meadows denies tinnitus, aural fullness, dizziness, and hearing concerns. Erin Meadows reports she has been seen at [facility] and at Urgent care and has been treated with antibiotics and nasal sprays. Erin Meadows reports continued recurrent otalgia. Erin Meadows reports her left otalgia is worse in the evenings.   Evaluation:  Otoscopy showed a clear view of the tympanic membranes, bilaterally Tympanometry results were consistent with normal middle ear pressure and normal tympanic membrane mobility (Type A), bilaterally.  Audiometric testing was completed using Conventional Audiometry techniques with insert earphones and TDH headphones. Test results are consistent with normal hearing sensitivity at [phone] Hz in both ears. Speech Recognition Thresholds were obtained at 15 dB HL in the right ear and at 15  dB HL in the left ear. Word Recognition Testing was completed at 50 dB HL and Erin Meadows scored 100%, bilaterally.    Results:  Today's test results are consistent with normal hearing sensitivity at [phone] Hz in both ears. A referral to an Ear, Nose, and Throat Physician was recommended due to recurrent left otalgia. The test results and recommendations were reviewed with Erin Meadows.   Recommendations: 1.   Referral to an Ear, Nose, and Throat Physician due to left otalgia occurring for 2 months.    25 minutes spent testing and counseling on results.   If you have any questions please feel free  to contact me at [phone].  Marton Redwood Audiologist, Au.D., CCC-A 09/14/2021  3:46 PM  Cc: Janeece Agee, NP

## 2021-09-29 ENCOUNTER — Telehealth: Payer: Self-pay | Admitting: Registered Nurse

## 2021-09-29 DIAGNOSIS — H9190 Unspecified hearing loss, unspecified ear: Secondary | ICD-10-CM

## 2021-09-29 NOTE — Telephone Encounter (Signed)
Caller name: [name]   On DPR? :yes/no: Yes  Call back number: [phone]  Provider they see: [name]   Reason for call: pt called stating that [name] put in a referral for her to see an  Audiologist. Pt states that she need to go see an Otolaryngologists. Please advise

## 2021-09-29 NOTE — Telephone Encounter (Signed)
Referral placed for ENT

## 2021-12-06 DIAGNOSIS — H9202 Otalgia, left ear: Secondary | ICD-10-CM | POA: Diagnosis not present

## 2022-01-04 DIAGNOSIS — Z1151 Encounter for screening for human papillomavirus (HPV): Secondary | ICD-10-CM | POA: Diagnosis not present

## 2022-01-04 DIAGNOSIS — Z01419 Encounter for gynecological examination (general) (routine) without abnormal findings: Secondary | ICD-10-CM | POA: Diagnosis not present

## 2022-01-04 DIAGNOSIS — Z124 Encounter for screening for malignant neoplasm of cervix: Secondary | ICD-10-CM | POA: Diagnosis not present

## 2022-03-21 DIAGNOSIS — H9202 Otalgia, left ear: Secondary | ICD-10-CM | POA: Diagnosis not present

## 2022-04-30 ENCOUNTER — Ambulatory Visit (HOSPITAL_COMMUNITY): Payer: Self-pay

## 2022-05-03 ENCOUNTER — Emergency Department (HOSPITAL_COMMUNITY): Payer: BC Managed Care – PPO

## 2022-05-03 ENCOUNTER — Encounter (HOSPITAL_COMMUNITY): Payer: Self-pay

## 2022-05-03 ENCOUNTER — Emergency Department (HOSPITAL_COMMUNITY)
Admission: EM | Admit: 2022-05-03 | Discharge: 2022-05-03 | Disposition: A | Discharge status: Home / Self Care | Payer: BC Managed Care – PPO | Attending: Emergency Medicine | Admitting: Emergency Medicine

## 2022-05-03 ENCOUNTER — Other Ambulatory Visit: Payer: Self-pay

## 2022-05-03 DIAGNOSIS — I10 Essential (primary) hypertension: Secondary | ICD-10-CM | POA: Diagnosis not present

## 2022-05-03 DIAGNOSIS — R079 Chest pain, unspecified: Secondary | ICD-10-CM

## 2022-05-03 DIAGNOSIS — R0789 Other chest pain: Secondary | ICD-10-CM | POA: Diagnosis not present

## 2022-05-03 DIAGNOSIS — R200 Anesthesia of skin: Secondary | ICD-10-CM | POA: Insufficient documentation

## 2022-05-03 DIAGNOSIS — E871 Hypo-osmolality and hyponatremia: Secondary | ICD-10-CM | POA: Diagnosis not present

## 2022-05-03 DIAGNOSIS — D72829 Elevated white blood cell count, unspecified: Secondary | ICD-10-CM | POA: Diagnosis not present

## 2022-05-03 DIAGNOSIS — R000 Tachycardia, unspecified: Secondary | ICD-10-CM | POA: Diagnosis not present

## 2022-05-03 LAB — TROPONIN I (HIGH SENSITIVITY)
Troponin I (High Sensitivity): 4 ng/L (ref <18)
Troponin I (High Sensitivity): 5 ng/L (ref <18)

## 2022-05-03 LAB — CBC
HCT: 49.3 % — ABNORMAL HIGH (ref 36.0–46.0)
Hemoglobin: 16.9 g/dL — ABNORMAL HIGH (ref 12.0–15.0)
MCH: 31.2 pg (ref 26.0–34.0)
MCHC: 34.3 g/dL (ref 30.0–36.0)
MCV: 91 fL (ref 80.0–100.0)
Platelets: 343 10*3/uL (ref 150–400)
RBC: 5.42 MIL/uL — ABNORMAL HIGH (ref 3.87–5.11)
RDW: 12.8 % (ref 11.5–15.5)
WBC: 11 10*3/uL — ABNORMAL HIGH (ref 4.0–10.5)
nRBC: 0 % (ref 0.0–0.2)

## 2022-05-03 LAB — BASIC METABOLIC PANEL
Anion gap: 8 (ref 5–15)
BUN: 11 mg/dL (ref 6–20)
CO2: 19 mmol/L — ABNORMAL LOW (ref 22–32)
Calcium: 9.1 mg/dL (ref 8.9–10.3)
Chloride: 105 mmol/L (ref 98–111)
Creatinine, Ser: 0.64 mg/dL (ref 0.44–1.00)
GFR, Estimated: >60 mL/min (ref >60)
Glucose, Bld: 132 mg/dL — ABNORMAL HIGH (ref 70–99)
Potassium: 3.6 mmol/L (ref 3.5–5.1)
Sodium: 132 mmol/L — ABNORMAL LOW (ref 135–145)

## 2022-05-03 LAB — D-DIMER, QUANTITATIVE: D-Dimer, Quant: <0.27 ug/mL-FEU (ref 0.00–0.50)

## 2022-05-03 MED ORDER — SODIUM CHLORIDE (PF) 0.9 % IJ SOLN
INTRAMUSCULAR | Status: AC
  Filled 2022-05-03: qty 50

## 2022-05-03 MED ORDER — MORPHINE SULFATE (PF) 2 MG/ML IV SOLN
2.0000 mg | Freq: Once | INTRAVENOUS | Status: AC
  Administered 2022-05-03: 2 mg via INTRAVENOUS
  Filled 2022-05-03: qty 1

## 2022-05-03 MED ORDER — IOHEXOL 350 MG/ML SOLN
75.0000 mL | Freq: Once | INTRAVENOUS | Status: AC | PRN
  Administered 2022-05-03: 75 mL via INTRAVENOUS

## 2022-05-03 MED ORDER — SODIUM CHLORIDE 0.9 % IV BOLUS
1000.0000 mL | Freq: Once | INTRAVENOUS | Status: AC
Start: 2022-05-03 — End: 2022-05-03
  Administered 2022-05-03: 1000 mL via INTRAVENOUS

## 2022-05-03 MED ORDER — OMEPRAZOLE 20 MG PO CPDR: 20.0000 mg | DELAYED_RELEASE_CAPSULE | Freq: Every day | ORAL | 0 refills | Status: AC

## 2022-05-03 NOTE — ED Provider Notes (Signed)
[facility] Provider Note   CSN: [id number] Arrival date & time: 05/03/22  0715     History  Chief Complaint  Patient presents with   Chest Pain    Erin Meadows is a 47 y.o. female with medical history of hypertension.  Patient presents to ED for evaluation of chest pain.  Patient reports that on Sunday she was sitting down watching her son's baseball game when she developed slow onset chest pain located on the left upper portion of her chest with radiation into her left arm causing numbness.  Patient states initially she thought it might been heartburn, she states she has history of heartburn, however heartburn has never felt like this before.  Patient reports that this feeling of chest pain continued and on Monday.  Patient reports that the pain is constant, described as a pressure on her chest.  She rates this pain at a 6 out of 10.  She denies exertional component.  Patient denies shortness of breath, nausea, vomiting, abdominal pain, back pain, leg swelling, syncope, lightheadedness.  Patient denies history of chest pain.  Patient denies history of blood clots, however reports that she does take birth control.  Patient reports remote history of hypertension, denies being currently medicated for this.   Chest Pain Associated symptoms: no abdominal pain, no nausea, no shortness of breath and no vomiting        Home Medications Prior to Admission medications   Medication Sig Start Date End Date Taking? Authorizing Provider  omeprazole (PRILOSEC) 20 MG capsule Take 1 capsule (20 mg total) by mouth daily. 05/03/22  Yes Azucena Cecil, PA-C  cetirizine (ZYRTEC ALLERGY) 10 MG tablet Take 1 tablet (10 mg total) by mouth at bedtime. 08/27/21 02/23/22  Lynden Oxford Scales, PA-C  fluconazole (DIFLUCAN) 150 MG tablet Take 1 tablet today.  Take second tablet 3 days later. 08/27/21   Lynden Oxford Scales, PA-C  Fluocinolone Acetonide  (DERMOTIC) 0.01 % OIL Place 5 drops in ear(s) 2 (two) times daily as needed (Itching in ears). 08/27/21   Lynden Oxford Scales, PA-C  ipratropium (ATROVENT) 0.06 % nasal spray Place 2 sprays into both nostrils 3 (three) times daily. As needed for nasal congestion, runny nose 08/27/21   Lynden Oxford Scales, PA-C  levonorgestrel-ethinyl estradiol (ALESSE) 0.1-20 MG-MCG tablet Take 1 tablet by mouth daily.    [provider]  mometasone (NASONEX) 50 MCG/ACT nasal spray Place 2 sprays into the nose daily. 08/27/21 02/23/22  Lynden Oxford Scales, PA-C  montelukast (SINGULAIR) 10 MG tablet Take 1 tablet (10 mg total) by mouth at bedtime. 08/27/21 02/23/22  Lynden Oxford Scales, PA-C      Allergies    Patient has no known allergies.    Review of Systems   Review of Systems  Respiratory:  Negative for shortness of breath.   Cardiovascular:  Positive for chest pain. Negative for leg swelling.  Gastrointestinal:  Negative for abdominal pain, nausea and vomiting.  All other systems reviewed and are negative.   Physical Exam Updated Vital Signs BP (!) 139/92   Pulse 90   Temp 98 F (36.7 C) (Oral)   Resp (!) 23   Wt 83 kg   LMP 04/12/2022   SpO2 98%   BMI 32.41 kg/m  Physical Exam Vitals and nursing note reviewed.  Constitutional:      General: She is not in acute distress.    Appearance: Normal appearance. She is not ill-appearing, toxic-appearing or diaphoretic.  HENT:     Head: Normocephalic and atraumatic.     Nose: Nose normal.     Mouth/Throat:     Mouth: Mucous membranes are moist.     Pharynx: Oropharynx is clear.  Eyes:     Extraocular Movements: Extraocular movements intact.     Conjunctiva/sclera: Conjunctivae normal.     Pupils: Pupils are equal, round, and reactive to light.  Cardiovascular:     Rate and Rhythm: Regular rhythm. Tachycardia present.  Pulmonary:     Effort: Pulmonary effort is normal. No respiratory distress.     Breath sounds: Normal  breath sounds. No wheezing.  Abdominal:     General: Abdomen is flat. Bowel sounds are normal.     Palpations: Abdomen is soft.     Tenderness: There is no abdominal tenderness.  Musculoskeletal:     Cervical back: Normal range of motion and neck supple. No tenderness.     Right lower leg: No edema.     Left lower leg: No edema.  Skin:    General: Skin is warm and dry.     Capillary Refill: Capillary refill takes less than 2 seconds.  Neurological:     Mental Status: She is alert and oriented to person, place, and time.     ED Results / Procedures / Treatments   Labs (all labs ordered are listed, but only abnormal results are displayed) Labs Reviewed  BASIC METABOLIC PANEL - Abnormal; Notable for the following components:      Result Value   Sodium 132 (*)    CO2 19 (*)    Glucose, Bld 132 (*)    All other components within normal limits  CBC - Abnormal; Notable for the following components:   WBC 11.0 (*)    RBC 5.42 (*)    Hemoglobin 16.9 (*)    HCT 49.3 (*)    All other components within normal limits  D-DIMER, QUANTITATIVE  TROPONIN I (HIGH SENSITIVITY)  TROPONIN I (HIGH SENSITIVITY)    EKG EKG Interpretation  Date/Time:  Tuesday May 03 2022 07:21:42 EDT Ventricular Rate:  132 PR Interval:  154 QRS Duration: 71 QT Interval:  322 QTC Calculation: 478 R Axis:   65 Text Interpretation: Sinus tachycardia rate related ST depression, otherwise normal, no old comparison Confirmed by Charlesetta Shanks [phone]) on 05/03/2022 9:05:56 AM  Radiology CT Angio Chest PE W and/or Wo Contrast  Result Date: 05/03/2022 CLINICAL DATA:  Pulmonary embolism suspected. High probability. Chest and sternal pain beginning yesterday. EXAM: CT ANGIOGRAPHY CHEST WITH CONTRAST TECHNIQUE: Multidetector CT imaging of the chest was performed using the standard protocol during bolus administration of intravenous contrast. Multiplanar CT image reconstructions and MIPs were obtained to evaluate the  vascular anatomy. RADIATION DOSE REDUCTION: This exam was performed according to the departmental dose-optimization program which includes automated exposure control, adjustment of the mA and/or kV according to patient size and/or use of iterative reconstruction technique. CONTRAST:  48mL OMNIPAQUE IOHEXOL 350 MG/ML SOLN COMPARISON:  Chest radiography same day FINDINGS: Cardiovascular: Heart size is normal. No coronary artery calcification or aortic atherosclerotic calcification. Pulmonary arterial opacification is satisfactory. No pulmonary emboli. Mediastinum/Nodes: No mass or adenopathy. Lungs/Pleura: Lungs are clear.  No pleural effusion. Upper Abdomen: Fatty change of the liver.  No focal finding. Musculoskeletal: Normal.  No spinal, rib or sternal abnormality. Review of the MIP images confirms the above findings. IMPRESSION: 1. No pulmonary emboli or other acute chest pathology. 2. Fatty change of the liver. Electronically Signed  By: Nelson Chimes M.D.   On: 05/03/2022 11:48   DG Chest Port 1 View  Result Date: 05/03/2022 CLINICAL DATA:  Centralized chest pain radiating into left arm for 1 day. EXAM: PORTABLE CHEST 1 VIEW COMPARISON:  None Available. FINDINGS: Cardiac silhouette and mediastinal contours are within normal limits. The lungs are clear. No pleural effusion or pneumothorax. No acute skeletal abnormality. IMPRESSION: No active disease. Electronically Signed   By: Yvonne Kendall M.D.   On: 05/03/2022 08:34    Procedures Procedures   Medications Ordered in ED Medications  sodium chloride 0.9 % bolus 1,000 mL (0 mLs Intravenous Stopped 05/03/22 1114)  morphine (PF) 2 MG/ML injection 2 mg (2 mg Intravenous Given 05/03/22 0914)  iohexol (OMNIPAQUE) 350 MG/ML injection 75 mL (75 mLs Intravenous Contrast Given 05/03/22 1127)  sodium chloride (PF) 0.9 % injection (  Given by Other 05/03/22 1149)    ED Course/ Medical Decision Making/ A&P  Medical Decision Making Amount and/or Complexity of  Data Reviewed Labs: ordered. Radiology: ordered.  Risk Prescription drug management.   47 year old female presents to ED for evaluation.  Please see HPI for further details.  On examination the patient is tachycardic however afebrile.  Lung sounds are clear bilaterally, she is not hypoxic, there is no peripheral edema.  Abdominal exam is benign, abdomen soft and compressible without tenderness.  Neurological examination without focal neurodeficits.  Patient nontoxic in appearance, speaking full sentences, no apparent distress.  Patient initial troponin 4, delta 5.  BMP with decreased sodium to 132 however no other electrolyte derangement.  D-dimer undetectable.  CBC with leukocytosis to 11 however most likely stress response, hemoglobin elevated as well.  Patient provided 1 L fluid for tachycardia, pulse rate is decreased at this time to 90.  CT angio chest shows no evidence of pulmonary embolism.  This test was ordered after consultation and discussion with my attending Dr. Vallery Ridge due to patient tachypnea, tachycardia.  Patient chest x-ray unremarkable.  Patient EKG is nonischemic.  Patient given 2 mg morphine for pain, reports that her chest pain has decreased to a 2 out of 10.  At this time, patient will be referred to cardiology through ambulatory referral.  The patient was given return precautions to include increased chest pain, shortness of breath, syncope.  Patient voiced understanding.  The patient will be sent home with a short course of PPI to assess.  Patient had all her questions answered her satisfaction.  The patient stable for discharge.   Final Clinical Impression(s) / ED Diagnoses Final diagnoses:  Nonspecific chest pain    Rx / DC Orders ED Discharge Orders          Ordered    omeprazole (PRILOSEC) 20 MG capsule  Daily        05/03/22 1228    Ambulatory referral to Cardiology        05/03/22 1228              Lawana Chambers 05/03/22 1228     Charlesetta Shanks, MD 05/04/22 1734

## 2022-05-03 NOTE — ED Triage Notes (Signed)
C/o centralized cp radiating into left arm x1 day that started when sitting at sons game.  Tachycardia noted in triage.  Denies sob, headache, n/v

## 2022-05-03 NOTE — ED Notes (Signed)
Dc instructions and scripts reviewed with pt no questions or concerns at this time. Will follow up with cardiology.  

## 2022-05-03 NOTE — Discharge Instructions (Addendum)
Please return to the ED with any new or worsening signs or symptoms Please follow-up with cardiology.  I have placed an ambulatory referral.  They should call you in the next 2 days.  If they do not, please call them. Please read attached guide concerning nonspecific chest pain Please begin taking PPI which I prescribed to your pharmacy once a day.

## 2022-05-03 NOTE — ED Provider Notes (Incomplete)
I provided a substantive portion of the care of this patient.  I personally made/approved the management plan for this patient and take responsibility for the patient management. {Remember to document shared critical care using "edcritical" dot phrase:1} EKG Interpretation  Date/Time:  Tuesday May 03 2022 07:21:42 EDT Ventricular Rate:  132 PR Interval:  154 QRS Duration: 71 QT Interval:  322 QTC Calculation: 478 R Axis:   65 Text Interpretation: Sinus tachycardia rate related ST depression, otherwise normal, no old comparison Confirmed by Charlesetta Shanks [phone]) on 05/03/2022 9:05:56 AM

## 2022-05-05 NOTE — Progress Notes (Signed)
Referring-[facility] at [facility] Reason for referral-chest pain  HPI: 47 year old female for evaluation of chest pain at request of [facility] at [place].  Seen in the emergency room May 03, 2022 with chest pain.  CTA showed no pulmonary embolus but there was note of fatty change of the liver.  Hemoglobin 16.9, troponin is normal, D-dimer negative, sodium 132, glucose 132, creatinine 0.64.  Cardiology now asked to evaluate.  Patient states she recently developed chest discomfort described as a pressure.  It did not radiate and no associated symptoms.  Not pleuritic or positional.  Pain with duration of approximately 12 hours.  Resolved spontaneously though there is mild residual discomfort since.  She otherwise denies dyspnea on exertion, orthopnea, PND, pedal edema or syncope.  No history of exertional chest pain.  Cardiology now asked to evaluate.  Current Outpatient Medications  Medication Sig Dispense Refill   fluconazole (DIFLUCAN) 150 MG tablet Take 1 tablet today.  Take second tablet 3 days later. 2 tablet 5   levonorgestrel-ethinyl estradiol (ALESSE) 0.1-20 MG-MCG tablet Take 1 tablet by mouth daily.     omeprazole (PRILOSEC) 20 MG capsule Take 1 capsule (20 mg total) by mouth daily. 30 capsule 0   cetirizine (ZYRTEC ALLERGY) 10 MG tablet Take 1 tablet (10 mg total) by mouth at bedtime. 90 tablet 1   Fluocinolone Acetonide (DERMOTIC) 0.01 % OIL Place 5 drops in ear(s) 2 (two) times daily as needed (Itching in ears). 20 mL 2   ipratropium (ATROVENT) 0.06 % nasal spray Place 2 sprays into both nostrils 3 (three) times daily. As needed for nasal congestion, runny nose 15 mL 2   mometasone (NASONEX) 50 MCG/ACT nasal spray Place 2 sprays into the nose daily. 17 g 5   montelukast (SINGULAIR) 10 MG tablet Take 1 tablet (10 mg total) by mouth at bedtime. 30 tablet 5   No current facility-administered medications for this visit.    No Known  Allergies   Past Medical History:  Diagnosis Date   Hypertension    mild no meds   Seasonal allergies     Past Surgical History:  Procedure Laterality Date   CERVICAL CONIZATION W/BX N/A 06/03/2015   Procedure: CONIZATION CERVIX WITH BIOPSY;  Surgeon: Christophe Louis, MD;  Location: [facility];  Service: Gynecology;  Laterality: N/A;    Social History   Socioeconomic History   Marital status: Divorced    Spouse name: Not on file   Number of children: 1   Years of education: Not on file   Highest education level: Not on file  Occupational History   Not on file  Tobacco Use   Smoking status: Never   Smokeless tobacco: Never  Substance and Sexual Activity   Alcohol use: Yes    Comment: occasionally   Drug use: No   Sexual activity: Yes    Birth control/protection: Pill  Other Topics Concern   Not on file  Social History Narrative   Not on file   Social Determinants of Health   Financial Resource Strain: Not on file  Food Insecurity: Not on file  Transportation Needs: Not on file  Physical Activity: Not on file  Stress: Not on file  Social Connections: Not on file  Intimate Partner Violence: Not on file    Family History  Problem Relation Age of Onset   Hypertension Father    Heart attack Father    Hyperlipidemia Brother    Hypertension Brother  ROS: no fevers or chills, productive cough, hemoptysis, dysphasia, odynophagia, melena, hematochezia, dysuria, hematuria, rash, seizure activity, orthopnea, PND, pedal edema, claudication. Remaining systems are negative.  Physical Exam:   Blood pressure 132/84, pulse 83, height 5\' 3"  (1.6 m), weight 180 lb 6.4 oz (81.8 kg), last menstrual period 04/12/2022, SpO2 99 %.  General:  Well developed/well nourished in NAD Skin warm/dry Patient not depressed No peripheral clubbing Back-normal HEENT-normal/normal eyelids Neck supple/normal carotid upstroke bilaterally; no bruits; no JVD; no thyromegaly chest - CTA/ normal  expansion CV - RRR/normal S1 and S2; no rubs or gallops;  PMI nondisplaced; 2/6 systolic ejection murmur left sternal border. Abdomen -NT/ND, no HSM, no mass, + bowel sounds, no bruit 2+ femoral pulses, no bruits Ext-no edema, chords, 2+ DP Neuro-grossly nonfocal  ECG -May 03, 2022-sinus tachycardia with no significant ST changes.  Personally reviewed  Electrocardiogram today shows normal sinus rhythm at a rate of 79, nonspecific anterior T wave changes.  A/P  1 chest pain-symptoms atypical.  I will arrange a cardiac CTA to rule out obstructive coronary disease.  2 hypertension-blood pressure is reasonable at present.  She will follow this and we can add medications as needed.  3 murmur-likely flow murmur.  Will arrange echocardiogram to further assess.  Kirk Ruths, MD

## 2022-05-06 ENCOUNTER — Ambulatory Visit: Payer: BC Managed Care – PPO | Attending: Cardiology | Admitting: Cardiology

## 2022-05-06 ENCOUNTER — Encounter: Payer: Self-pay | Admitting: Cardiology

## 2022-05-06 VITALS — BP 132/84 | HR 83 | Ht 63.0 in | Wt 180.4 lb

## 2022-05-06 DIAGNOSIS — R072 Precordial pain: Secondary | ICD-10-CM | POA: Diagnosis not present

## 2022-05-06 DIAGNOSIS — R011 Cardiac murmur, unspecified: Secondary | ICD-10-CM | POA: Diagnosis not present

## 2022-05-06 DIAGNOSIS — I10 Essential (primary) hypertension: Secondary | ICD-10-CM | POA: Diagnosis not present

## 2022-05-06 MED ORDER — METOPROLOL TARTRATE 100 MG PO TABS
ORAL_TABLET | ORAL | 0 refills | Status: AC
Start: 2022-05-06 — End: ?

## 2022-05-06 NOTE — Patient Instructions (Signed)
Testing/Procedures:  Your physician has requested that you have an echocardiogram. Echocardiography is a painless test that uses sound waves to create images of your heart. It provides your doctor with information about the size and shape of your heart and how well your heart's chambers and valves are working. This procedure takes approximately one hour. There are no restrictions for this procedure. Please do NOT wear cologne, perfume, aftershave, or lotions (deodorant is allowed). Please arrive 15 minutes prior to your appointment time. [place]     Your cardiac CT will be scheduled at   [facility] [place], [place] [address] [phone]   If scheduled at [facility], please arrive at the [facility] En[facility] (Entrance C2) of [facility] 30 minutes prior to test start time. You can use the FREE valet parking offered at entrance C (encouraged to control the heart rate for the test)  Proceed to the [facility] Radiology Department (first floor) to check-in and test prep.  All radiology patients and guests should use entrance C2 at [facility], accessed from [facility], even though the hospital's physical address listed is [address].     Please follow these instructions carefully (unless otherwise directed):   On the Night Before the Test: Be sure to Drink plenty of water. Do not consume any caffeinated/decaffeinated beverages or chocolate 12 hours prior to your test. Do not take any antihistamines 12 hours prior to your test.   On the Day of the Test: Drink plenty of water until 1 hour prior to the test. Do not eat any food 1 hour prior to test. You may take your regular medications prior to the test.  Take metoprolol (Lopressor) 100 MG  two hours prior to test. FEMALES- please wear underwire-free bra if available, avoid dresses & tight clothing       After the  Test: Drink plenty of water. After receiving IV contrast, you may experience a mild flushed feeling. This is normal. On occasion, you may experience a mild rash up to 24 hours after the test. This is not dangerous. If this occurs, you can take Benadryl 25 mg and increase your fluid intake. If you experience trouble breathing, this can be serious. If it is severe call 911 IMMEDIATELY. If it is mild, please call our office.  We will call to schedule your test 2-4 weeks out understanding that some insurance companies will need an authorization prior to the service being performed.   For non-scheduling related questions, please contact the cardiac imaging nurse navigator should you have any questions/concerns: Marchia Bond, Cardiac Imaging Nurse Navigator Gordy Clement, Cardiac Imaging Nurse Navigator [facility] Direct Office Dial: [phone]   For scheduling needs, including cancellations and rescheduling, please call [place], [phone].    Follow-Up: At [facility], you and your health needs are our priority.  As part of our continuing mission to provide you with exceptional heart care, we have created designated Provider Care Teams.  These Care Teams include your primary Cardiologist (physician) and Advanced Practice Providers (APPs -  Physician Assistants and Nurse Practitioners) who all work together to provide you with the care you need, when you need it.  We recommend signing up for the patient portal called "MyChart".  Sign up information is provided on this After Visit Summary.  MyChart is used to connect with patients for Virtual Visits (Telemedicine).  Patients are able to  view lab/test results, encounter notes, upcoming appointments, etc.  Non-urgent messages can be sent to your provider as well.   To learn more about what you can do with MyChart, go to [email or web address].    Your next appointment:    AS NEEDED

## 2022-05-07 ENCOUNTER — Encounter (HOSPITAL_COMMUNITY): Payer: Self-pay

## 2022-05-07 ENCOUNTER — Other Ambulatory Visit: Payer: Self-pay

## 2022-05-07 ENCOUNTER — Emergency Department (HOSPITAL_COMMUNITY)
Admission: EM | Admit: 2022-05-07 | Discharge: 2022-05-07 | Disposition: A | Discharge status: Home / Self Care | Payer: BC Managed Care – PPO | Attending: Emergency Medicine | Admitting: Emergency Medicine

## 2022-05-07 DIAGNOSIS — R0789 Other chest pain: Secondary | ICD-10-CM | POA: Diagnosis not present

## 2022-05-07 DIAGNOSIS — R079 Chest pain, unspecified: Secondary | ICD-10-CM | POA: Diagnosis not present

## 2022-05-07 LAB — I-STAT CHEM 8, ED
BUN: 5 mg/dL — ABNORMAL LOW (ref 6–20)
Calcium, Ion: 1.17 mmol/L (ref 1.15–1.40)
Chloride: 106 mmol/L (ref 98–111)
Creatinine, Ser: 0.5 mg/dL (ref 0.44–1.00)
Glucose, Bld: 112 mg/dL — ABNORMAL HIGH (ref 70–99)
HCT: 47 % — ABNORMAL HIGH (ref 36.0–46.0)
Hemoglobin: 16 g/dL — ABNORMAL HIGH (ref 12.0–15.0)
Potassium: 3.7 mmol/L (ref 3.5–5.1)
Sodium: 141 mmol/L (ref 135–145)
TCO2: 22 mmol/L (ref 22–32)

## 2022-05-07 LAB — TSH: TSH: 1.249 u[IU]/mL (ref 0.350–4.500)

## 2022-05-07 LAB — TROPONIN I (HIGH SENSITIVITY): Troponin I (High Sensitivity): 3 ng/L (ref <18)

## 2022-05-07 NOTE — ED Triage Notes (Signed)
Was given Prilosec with no changes seen from this

## 2022-05-07 NOTE — Discharge Instructions (Addendum)
You have been diagnosed by your caregiver as having chest wall pain. °SEEK IMMEDIATE MEDICAL ATTENTION IF: °You develop a fever.  °Your chest pains become severe or intolerable.  °You develop new, unexplained symptoms (problems).  °You develop shortness of breath, nausea, vomiting, sweating or feel light headed.  °You develop a new cough or you cough up blood. ° °

## 2022-05-07 NOTE — ED Provider Notes (Signed)
[facility] Provider Note   CSN: [id number] Arrival date & time: 05/07/22  [id number]     History  Chief Complaint  Patient presents with   Chest Pain    Erin Meadows is a 47 y.o. female presents emergency department chief complaint of left sided chest discomfort.  Patient was seen 2 days ago in the emergency department with left-sided chest pain radiating to the left shoulder.  Her pain is not aggravated or worsened by exertion.  It has been persistent for the past 3 days.  This morning it seemed a little bit worse.  Patient saw Dr. Stanford Breed yesterday.  She was noted to have what is thought to be a systolic flow murmur and is scheduled for an outpatient echocardiogram and CT coronary angiogram.  Patient states that she is still feeling the chest discomfort but has moved to her left shoulder a bit more.  She denies any nausea, vomiting, diaphoresis.  She had a CT angiogram 2 days ago that was negative for pulmonary embolus.  She has no significant risk factors for coronary artery disease except father who had heart attack in his 54s.  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  She is concerned she could be health risk which is making her feel a bit anxious.  HPI     Home Medications Prior to Admission medications   Medication Sig Start Date End Date Taking? Authorizing Provider  cetirizine (ZYRTEC ALLERGY) 10 MG tablet Take 1 tablet (10 mg total) by mouth at bedtime. 08/27/21 02/23/22  Lynden Oxford Scales, PA-C  fluconazole (DIFLUCAN) 150 MG tablet Take 1 tablet today.  Take second tablet 3 days later. 08/27/21   Lynden Oxford Scales, PA-C  Fluocinolone Acetonide (DERMOTIC) 0.01 % OIL Place 5 drops in ear(s) 2 (two) times daily as needed (Itching in ears). 08/27/21    Lynden Oxford Scales, PA-C  ipratropium (ATROVENT) 0.06 % nasal spray Place 2 sprays into both nostrils 3 (three) times daily. As needed for nasal congestion, runny nose 08/27/21   Lynden Oxford Scales, PA-C  levonorgestrel-ethinyl estradiol (ALESSE) 0.1-20 MG-MCG tablet Take 1 tablet by mouth daily.    [provider]  metoprolol tartrate (LOPRESSOR) 100 MG tablet TAKE 2 HOURS PRIOR TO CT SCAN 05/06/22   Lelon Perla, MD  mometasone (NASONEX) 50 MCG/ACT nasal spray Place 2 sprays into the nose daily. 08/27/21 02/23/22  Lynden Oxford Scales, PA-C  montelukast (SINGULAIR) 10 MG tablet Take 1 tablet (10 mg total) by mouth at bedtime. 08/27/21 02/23/22  Lynden Oxford Scales, PA-C  omeprazole (PRILOSEC) 20 MG capsule Take 1 capsule (20 mg total) by mouth daily. 05/03/22   Azucena Cecil, PA-C      Allergies    Patient has no known allergies.    Review of Systems   Review of Systems  Physical Exam Updated Vital Signs Ht 5\' 3"  (1.6 m)   Wt 81.8 kg   LMP 04/12/2022 (Approximate)   BMI 31.95 kg/m  Physical Exam Vitals and nursing note reviewed.  Constitutional:      General: She is not in acute distress.    Appearance: She is well-developed. She is not diaphoretic.  HENT:     Head: Normocephalic and atraumatic.     Right Ear:  External ear normal.     Left Ear: External ear normal.     Nose: Nose normal.     Mouth/Throat:     Mouth: Mucous membranes are moist.  Eyes:     General: No scleral icterus.    Conjunctiva/sclera: Conjunctivae normal.  Cardiovascular:     Rate and Rhythm: Normal rate and regular rhythm.     Heart sounds: Murmur heard.     Systolic murmur is present.     No friction rub. No gallop.  Pulmonary:     Effort: Pulmonary effort is normal. No respiratory distress.     Breath sounds: Normal breath sounds.  Chest:     Chest wall: Tenderness present.       Comments: Chest wall tenderness present.  Patient states this feels similar to the  discomfort she has been feeling Abdominal:     General: Bowel sounds are normal. There is no distension.     Palpations: Abdomen is soft. There is no mass.     Tenderness: There is no abdominal tenderness. There is no guarding.  Musculoskeletal:     Cervical back: Normal range of motion.  Skin:    General: Skin is warm and dry.  Neurological:     Mental Status: She is alert and oriented to person, place, and time.  Psychiatric:        Behavior: Behavior normal.     ED Results / Procedures / Treatments   Labs (all labs ordered are listed, but only abnormal results are displayed) Labs Reviewed  I-STAT CHEM 8, ED - Abnormal; Notable for the following components:      Result Value   BUN 5 (*)    Glucose, Bld 112 (*)    Hemoglobin 16.0 (*)    HCT 47.0 (*)    All other components within normal limits  TSH  TROPONIN I (HIGH SENSITIVITY)    EKG EKG Interpretation  Date/Time:  Saturday May 07 2022 09:07:55 EDT Ventricular Rate:  96 PR Interval:  163 QRS Duration: 84 QT Interval:  326 QTC Calculation: 412 R Axis:   39 Text Interpretation: Sinus rhythm Borderline T abnormalities, anterior leads since last tracing no significant change Confirmed by Malvin Johns [phone]) on 05/07/2022 9:25:27 AM  Radiology No results found.  Procedures Procedures    Medications Ordered in ED Medications - No data to display  ED Course/ Medical Decision Making/ A&P Clinical Course as of 05/07/22 1053  Sat May 07, 2022  1051 TSH [AH]  1051 Troponin I (High Sensitivity) [AH]  1051 I-stat chem 8, ED (not at [facility], [facility] or [facility])(!) [AH]    Clinical Course User Index [AH] Margarita Mail, PA-C             HEART Score: 1                Medical Decision Making Given the large differential diagnosis for Inetta Fermo, the decision making in this case is of high complexity.  After evaluating all of the data points in this case, the presentation of Lorilee Waldie is NOT consistent  with Acute Coronary Syndrome (ACS) and/or myocardial ischemia, pulmonary embolism, aortic dissection; Borhaave's, significant arrythmia, pneumothorax, cardiac tamponade, or other emergent cardiopulmonary condition.  Further, the presentation of Lanett Dapice is NOT consistent with pericarditis, myocarditis, cholecystitis, pancreatitis, mediastinitis, endocarditis, new valvular disease.  Additionally, the presentation of Reyana Valero NOT consistent with flail chest, cardiac contusion, ARDS, or significant intra-thoracic or intra-abdominal bleeding.  Moreover, this presentation is  NOT consistent with pneumonia, sepsis, or pyelonephritis.  The patient has a   Heart score of 1. Pain is reproducible to palpation of chest wall I suspect her tachycardia is secondary to anxiety about the situation as initial heart rate was about 120 when I walked in the door but after sitting and talking with the patient for 10 minutes or so her heart rate dropped down into the 80s and her blood pressure went from about 178 down to 143   Strict return and follow-up precautions have been given by me personally or by detailed written instruction given verbally by nursing staff using the teach back method to the patient/family/caregiver(s).  Data Reviewed/Counseling: I have reviewed the patient's vital signs, nursing notes, and other relevant tests/information. I had a detailed discussion regarding the historical points, exam findings, and any diagnostic results supporting the discharge diagnosis. I also discussed the need for outpatient follow-up and the need to return to the ED if symptoms worsen or if there are any questions or concerns that arise at home.    Amount and/or Complexity of Data Reviewed Labs: ordered. Decision-making details documented in ED Course. ECG/medicine tests: ordered and independent interpretation performed.    Details: EKG shows normal sinus rhythm at a rate of 96, unchanged from  previous tracing           Final Clinical Impression(s) / ED Diagnoses Final diagnoses:  Chest wall pain    Rx / DC Orders ED Discharge Orders     None         Margarita Mail, PA-C 05/07/22 1101    Malvin Johns, MD 05/07/22 1115

## 2022-05-07 NOTE — ED Triage Notes (Signed)
Started Monday with CP was tolerable Tuesday Wednesday and thursday and Friday tolerable enough to go to work was seen Tuesday at [facility] long sent home went to cardiology yesterday the pain still there but still noticeable all this week then at 2am this morning the CP went into her arm feels like pressure.

## 2022-05-10 NOTE — Addendum Note (Signed)
Addended by: Agnes Lawrence on: 05/10/2022 10:11 AM   Modules accepted: Orders

## 2022-05-18 ENCOUNTER — Encounter: Payer: Self-pay | Admitting: Physician Assistant

## 2022-05-18 ENCOUNTER — Other Ambulatory Visit: Payer: Self-pay | Admitting: Physician Assistant

## 2022-05-18 DIAGNOSIS — H9202 Otalgia, left ear: Secondary | ICD-10-CM

## 2022-06-03 ENCOUNTER — Telehealth (HOSPITAL_COMMUNITY): Payer: Self-pay | Admitting: *Deleted

## 2022-06-03 NOTE — Telephone Encounter (Signed)
Reaching out to patient to offer assistance regarding upcoming cardiac imaging study; pt verbalizes understanding of appt date/time, parking situation and where to check in, pre-test NPO status and medications ordered, and verified current allergies; name and call back number provided for further questions should they arise  Tymeir Weathington RN Navigator Cardiac Imaging [facility] [phone] office [phone] cell  Patient to take 100mg metoprolol tartrate two hours prior to her cardiac CT scan. She is aware to arrive at 7:30am. 

## 2022-06-06 ENCOUNTER — Ambulatory Visit (HOSPITAL_BASED_OUTPATIENT_CLINIC_OR_DEPARTMENT_OTHER): Payer: BC Managed Care – PPO

## 2022-06-06 ENCOUNTER — Ambulatory Visit (HOSPITAL_COMMUNITY)
Admission: RE | Admit: 2022-06-06 | Discharge: 2022-06-06 | Disposition: A | Discharge status: Home / Self Care | Payer: BC Managed Care – PPO | Source: Ambulatory Visit | Attending: Cardiology | Admitting: Cardiology

## 2022-06-06 DIAGNOSIS — R011 Cardiac murmur, unspecified: Secondary | ICD-10-CM | POA: Insufficient documentation

## 2022-06-06 DIAGNOSIS — R072 Precordial pain: Secondary | ICD-10-CM | POA: Insufficient documentation

## 2022-06-06 LAB — ECHOCARDIOGRAM COMPLETE
Area-P 1/2: 3.07 cm2
S' Lateral: 2.2 cm

## 2022-06-06 MED ORDER — NITROGLYCERIN 0.4 MG SL SUBL
SUBLINGUAL_TABLET | SUBLINGUAL | Status: AC
  Filled 2022-06-06: qty 2

## 2022-06-06 MED ORDER — NITROGLYCERIN 0.4 MG SL SUBL
0.8000 mg | SUBLINGUAL_TABLET | Freq: Once | SUBLINGUAL | Status: AC
  Administered 2022-06-06: 0.8 mg via SUBLINGUAL

## 2022-06-06 MED ORDER — SODIUM CHLORIDE 0.9 % IV BOLUS
500.0000 mL | Freq: Once | INTRAVENOUS | Status: AC
  Administered 2022-06-06: 500 mL via INTRAVENOUS

## 2022-06-06 MED ORDER — IOHEXOL 350 MG/ML SOLN
95.0000 mL | Freq: Once | INTRAVENOUS | Status: AC | PRN
  Administered 2022-06-06: 95 mL via INTRAVENOUS

## 2022-06-06 NOTE — Progress Notes (Signed)
Shortly after obtaining IV access, cardiac monitor begins to alarm with sudden drop in HR from 70s to 82s with ectopy. BP obtained and reads 80s systolic. Patient laid back in chair with legs elevated. IV fluid therapy initiated. Patient is pale in face with no LOC

## 2022-06-07 ENCOUNTER — Telehealth: Payer: Self-pay | Admitting: *Deleted

## 2022-06-07 ENCOUNTER — Ambulatory Visit (HOSPITAL_COMMUNITY): Payer: BC Managed Care – PPO

## 2022-06-07 ENCOUNTER — Ambulatory Visit
Admission: RE | Admit: 2022-06-07 | Discharge: 2022-06-07 | Disposition: A | Discharge status: Home / Self Care | Payer: BC Managed Care – PPO | Source: Ambulatory Visit | Attending: Physician Assistant | Admitting: Physician Assistant

## 2022-06-07 DIAGNOSIS — I2510 Atherosclerotic heart disease of native coronary artery without angina pectoris: Secondary | ICD-10-CM

## 2022-06-07 DIAGNOSIS — H9202 Otalgia, left ear: Secondary | ICD-10-CM | POA: Diagnosis not present

## 2022-06-07 MED ORDER — IOPAMIDOL (ISOVUE-300) INJECTION 61%
75.0000 mL | Freq: Once | INTRAVENOUS | Status: AC | PRN
  Administered 2022-06-07: 75 mL via INTRAVENOUS

## 2022-06-07 MED ORDER — ROSUVASTATIN CALCIUM 20 MG PO TABS: 20.0000 mg | ORAL_TABLET | Freq: Every day | ORAL | 3 refills | Status: DC

## 2022-06-07 NOTE — Telephone Encounter (Signed)
Pt has reviewed results via my chart  New script sent to the pharmacy  Lab orders mailed to the pt  

## 2022-06-07 NOTE — Telephone Encounter (Signed)
-----   Message from Lewayne Bunting, MD sent at 06/06/2022  4:42 PM EDT ----- Minimal plaque; would treat with crestor 20 mg daily; lipids and liver 8 weeks Olga Millers

## 2022-06-14 ENCOUNTER — Encounter: Payer: Self-pay | Admitting: Cardiology

## 2022-08-16 ENCOUNTER — Encounter: Payer: Self-pay | Admitting: *Deleted

## 2022-08-19 DIAGNOSIS — I2510 Atherosclerotic heart disease of native coronary artery without angina pectoris: Secondary | ICD-10-CM | POA: Diagnosis not present

## 2022-08-20 LAB — HEPATIC FUNCTION PANEL
ALT: 14 IU/L (ref 0–32)
AST: 19 IU/L (ref 0–40)
Albumin: 4.3 g/dL (ref 3.9–4.9)
Alkaline Phosphatase: 86 IU/L (ref 44–121)
Bilirubin Total: 0.6 mg/dL (ref 0.0–1.2)
Bilirubin, Direct: 0.15 mg/dL (ref 0.00–0.40)
Total Protein: 6.9 g/dL (ref 6.0–8.5)

## 2022-08-20 LAB — LIPID PANEL
Chol/HDL Ratio: 2.3 ratio (ref 0.0–4.4)
Cholesterol, Total: 132 mg/dL (ref 100–199)
HDL: 58 mg/dL (ref >39)
LDL Chol Calc (NIH): 57 mg/dL (ref 0–99)
Triglycerides: 93 mg/dL (ref 0–149)
VLDL Cholesterol Cal: 17 mg/dL (ref 5–40)

## 2022-08-22 ENCOUNTER — Encounter: Payer: Self-pay | Admitting: Cardiology

## 2022-10-11 DIAGNOSIS — H35373 Puckering of macula, bilateral: Secondary | ICD-10-CM | POA: Diagnosis not present

## 2022-10-11 DIAGNOSIS — H35342 Macular cyst, hole, or pseudohole, left eye: Secondary | ICD-10-CM | POA: Diagnosis not present

## 2022-10-11 DIAGNOSIS — H0102B Squamous blepharitis left eye, upper and lower eyelids: Secondary | ICD-10-CM | POA: Diagnosis not present

## 2022-10-11 DIAGNOSIS — H0102A Squamous blepharitis right eye, upper and lower eyelids: Secondary | ICD-10-CM | POA: Diagnosis not present

## 2022-12-08 ENCOUNTER — Telehealth: Payer: Self-pay | Admitting: Cardiology

## 2022-12-08 NOTE — Telephone Encounter (Signed)
Patient currently reporting "dull" CP. On a scale from 1-10 patient rated her pain at 2.  NO other symptoms reported at this time. Conferred with DOD and recommendation was for patient to go to ER. Patient stated "I do not want to go to the emergency room again". But verbalized understanding of recommendation for emergency care. No further questions at this time.

## 2022-12-08 NOTE — Telephone Encounter (Signed)
   Pt c/o of Chest Pain: STAT if active CP, including tightness, pressure, jaw pain, radiating pain to shoulder/upper arm/back, CP unrelieved by Nitro. Symptoms reported of SOB, nausea, vomiting, sweating.  1. Are you having CP right now? Patient states it's very dull pain.     2. Are you experiencing any other symptoms (ex. SOB, nausea, vomiting, sweating)? No other symptoms   3. Is your CP continuous or coming and going? Pain comes and goes.    4. Have you taken Nitroglycerin? no   5. How long have you been experiencing CP? Off and on since Monday.     6. If NO CP at time of call then end call with telling Pt to call back or call 911 if Chest pain returns prior to return call from triage team.

## 2022-12-10 ENCOUNTER — Other Ambulatory Visit: Payer: Self-pay

## 2022-12-10 ENCOUNTER — Encounter (HOSPITAL_COMMUNITY): Payer: Self-pay

## 2022-12-10 ENCOUNTER — Emergency Department (HOSPITAL_COMMUNITY): Payer: BC Managed Care – PPO

## 2022-12-10 ENCOUNTER — Emergency Department (HOSPITAL_COMMUNITY)
Admission: EM | Admit: 2022-12-10 | Discharge: 2022-12-10 | Disposition: A | Discharge status: Home / Self Care | Payer: BC Managed Care – PPO | Attending: Emergency Medicine | Admitting: Emergency Medicine

## 2022-12-10 DIAGNOSIS — R079 Chest pain, unspecified: Secondary | ICD-10-CM | POA: Diagnosis not present

## 2022-12-10 DIAGNOSIS — Z79899 Other long term (current) drug therapy: Secondary | ICD-10-CM | POA: Diagnosis not present

## 2022-12-10 DIAGNOSIS — R0789 Other chest pain: Secondary | ICD-10-CM | POA: Diagnosis not present

## 2022-12-10 DIAGNOSIS — I10 Essential (primary) hypertension: Secondary | ICD-10-CM | POA: Diagnosis not present

## 2022-12-10 DIAGNOSIS — R000 Tachycardia, unspecified: Secondary | ICD-10-CM | POA: Insufficient documentation

## 2022-12-10 LAB — BASIC METABOLIC PANEL
Anion gap: 11 (ref 5–15)
BUN: 8 mg/dL (ref 6–20)
CO2: 22 mmol/L (ref 22–32)
Calcium: 9.4 mg/dL (ref 8.9–10.3)
Chloride: 106 mmol/L (ref 98–111)
Creatinine, Ser: 0.68 mg/dL (ref 0.44–1.00)
GFR, Estimated: >60 mL/min (ref >60)
Glucose, Bld: 109 mg/dL — ABNORMAL HIGH (ref 70–99)
Potassium: 4 mmol/L (ref 3.5–5.1)
Sodium: 139 mmol/L (ref 135–145)

## 2022-12-10 LAB — HEPATIC FUNCTION PANEL
ALT: 56 U/L — ABNORMAL HIGH (ref 0–44)
AST: 69 U/L — ABNORMAL HIGH (ref 15–41)
Albumin: 4.1 g/dL (ref 3.5–5.0)
Alkaline Phosphatase: 63 U/L (ref 38–126)
Bilirubin, Direct: 0.2 mg/dL (ref 0.0–0.2)
Indirect Bilirubin: 0.9 mg/dL (ref 0.3–0.9)
Total Bilirubin: 1.1 mg/dL (ref 0.3–1.2)
Total Protein: 7.3 g/dL (ref 6.5–8.1)

## 2022-12-10 LAB — TROPONIN I (HIGH SENSITIVITY)
Troponin I (High Sensitivity): 3 ng/L (ref <18)
Troponin I (High Sensitivity): 3 ng/L (ref <18)

## 2022-12-10 LAB — TSH: TSH: 1.612 u[IU]/mL (ref 0.350–4.500)

## 2022-12-10 LAB — HCG, SERUM, QUALITATIVE: Preg, Serum: NEGATIVE

## 2022-12-10 LAB — CBC
HCT: 47.8 % — ABNORMAL HIGH (ref 36.0–46.0)
Hemoglobin: 16.3 g/dL — ABNORMAL HIGH (ref 12.0–15.0)
MCH: 31.6 pg (ref 26.0–34.0)
MCHC: 34.1 g/dL (ref 30.0–36.0)
MCV: 92.6 fL (ref 80.0–100.0)
Platelets: 294 10*3/uL (ref 150–400)
RBC: 5.16 MIL/uL — ABNORMAL HIGH (ref 3.87–5.11)
RDW: 12.6 % (ref 11.5–15.5)
WBC: 6.7 10*3/uL (ref 4.0–10.5)
nRBC: 0 % (ref 0.0–0.2)

## 2022-12-10 LAB — D-DIMER, QUANTITATIVE: D-Dimer, Quant: <0.27 ug{FEU}/mL (ref 0.00–0.50)

## 2022-12-10 MED ORDER — KETOROLAC TROMETHAMINE 30 MG/ML IJ SOLN
30.0000 mg | Freq: Once | INTRAMUSCULAR | Status: AC
  Administered 2022-12-10: 30 mg via INTRAVENOUS
  Filled 2022-12-10: qty 1

## 2022-12-10 NOTE — ED Provider Notes (Signed)
[facility] AT [facility] Provider Note   CSN: [id number] Arrival date & time: 12/10/22  0847     History  Chief Complaint  Patient presents with   Chest Pain    Erin Meadows is a 47 y.o. female.  Pt is a 47 yo female with pmhx significant for htn, gerd, and high cholesterol.  Pt presents to the ED today with cp for 5 days.  She did have a coronary artery ct scan in April which showed a coronary calcium score of 0 and nl coronary arteries.  Pt said the pain is worse when exposed to the cold.  Pain feels like a "tickle" in her chest.  No sob.  No n/v.        Home Medications Prior to Admission medications   Medication Sig Start Date End Date Taking? Authorizing Provider  cetirizine (ZYRTEC ALLERGY) 10 MG tablet Take 1 tablet (10 mg total) by mouth at bedtime. 08/27/21 02/23/22  Theadora Rama Scales, PA-C  fluconazole (DIFLUCAN) 150 MG tablet Take 1 tablet today.  Take second tablet 3 days later. 08/27/21   Theadora Rama Scales, PA-C  Fluocinolone Acetonide (DERMOTIC) 0.01 % OIL Place 5 drops in ear(s) 2 (two) times daily as needed (Itching in ears). 08/27/21   Theadora Rama Scales, PA-C  ipratropium (ATROVENT) 0.06 % nasal spray Place 2 sprays into both nostrils 3 (three) times daily. As needed for nasal congestion, runny nose 08/27/21   Theadora Rama Scales, PA-C  levonorgestrel-ethinyl estradiol (ALESSE) 0.1-20 MG-MCG tablet Take 1 tablet by mouth daily.    [provider]  metoprolol tartrate (LOPRESSOR) 100 MG tablet TAKE 2 HOURS PRIOR TO CT SCAN 05/06/22   Lewayne Bunting, MD  mometasone (NASONEX) 50 MCG/ACT nasal spray Place 2 sprays into the nose daily. 08/27/21 02/23/22  Theadora Rama Scales, PA-C  montelukast (SINGULAIR) 10 MG tablet Take 1 tablet (10 mg total) by mouth at bedtime. 08/27/21 02/23/22  Theadora Rama Scales, PA-C  omeprazole (PRILOSEC) 20 MG capsule Take 1 capsule (20 mg total) by mouth daily. 05/03/22   Al Decant, PA-C  rosuvastatin (CRESTOR) 20 MG tablet Take 1 tablet (20 mg total) by mouth daily. 06/07/22   Lewayne Bunting, MD      Allergies    Patient has no known allergies.    Review of Systems   Review of Systems  Cardiovascular:  Positive for chest pain.  All other systems reviewed and are negative.   Physical Exam Updated Vital Signs BP (!) 159/96 (BP Location: Right Arm)   Pulse (!) 113   Temp 98.2 F (36.8 C)   Resp 14   Ht 5\' 3"  (1.6 m)   Wt 70.3 kg   SpO2 96%   BMI 27.46 kg/m  Physical Exam Vitals and nursing note reviewed.  Constitutional:      Appearance: She is well-developed.  HENT:     Head: Normocephalic and atraumatic.  Eyes:     Extraocular Movements: Extraocular movements intact.     Pupils: Pupils are equal, round, and reactive to light.  Cardiovascular:     Rate and Rhythm: Regular rhythm. Tachycardia present.     Heart sounds: Normal heart sounds.  Pulmonary:     Effort: Pulmonary effort is normal.     Breath sounds: Normal breath sounds.  Abdominal:     General: Bowel sounds are normal.     Palpations: Abdomen is soft.  Musculoskeletal:        General:  Normal range of motion.     Cervical back: Normal range of motion and neck supple.  Skin:    General: Skin is warm.     Capillary Refill: Capillary refill takes less than 2 seconds.  Neurological:     General: No focal deficit present.     Mental Status: She is alert and oriented to person, place, and time.  Psychiatric:        Mood and Affect: Mood normal.        Behavior: Behavior normal.     ED Results / Procedures / Treatments   Labs (all labs ordered are listed, but only abnormal results are displayed) Labs Reviewed  BASIC METABOLIC PANEL - Abnormal; Notable for the following components:      Result Value   Glucose, Bld 109 (*)    All other components within normal limits  CBC - Abnormal; Notable for the following components:   RBC 5.16 (*)    Hemoglobin 16.3 (*)     HCT 47.8 (*)    All other components within normal limits  HEPATIC FUNCTION PANEL - Abnormal; Notable for the following components:   AST 69 (*)    ALT 56 (*)    All other components within normal limits  HCG, SERUM, QUALITATIVE  TSH  D-DIMER, QUANTITATIVE  TROPONIN I (HIGH SENSITIVITY)  TROPONIN I (HIGH SENSITIVITY)    EKG EKG Interpretation Date/Time:  Saturday December 10 2022 09:05:13 EDT Ventricular Rate:  106 PR Interval:  154 QRS Duration:  70 QT Interval:  314 QTC Calculation: 417 R Axis:   68  Text Interpretation: Sinus tachycardia Septal infarct , age undetermined Abnormal ECG When compared with ECG of 07-May-2022 09:07, PREVIOUS ECG IS PRESENT Since last tracing rate faster Confirmed by Jacalyn Lefevre [phone]) on 12/10/2022 9:24:47 AM  Radiology DG Chest 2 View  Result Date: 12/10/2022 CLINICAL DATA:  Chest pain EXAM: CHEST - 2 VIEW COMPARISON:  05/03/2022 FINDINGS: The heart size and mediastinal contours are within normal limits. Both lungs are clear. The visualized skeletal structures are unremarkable. IMPRESSION: No active cardiopulmonary disease. Electronically Signed   By: Signa Kell M.D.   On: 12/10/2022 10:40    Procedures Procedures    Medications Ordered in ED Medications  ketorolac (TORADOL) 30 MG/ML injection 30 mg (30 mg Intravenous Given 12/10/22 1017)    ED Course/ Medical Decision Making/ A&P                                 Medical Decision Making Amount and/or Complexity of Data Reviewed Labs: ordered. Radiology: ordered.  Risk Prescription drug management.   This patient presents to the ED for concern of cp, this involves an extensive number of treatment options, and is a complaint that carries with it a high risk of complications and morbidity.  The differential diagnosis includes cardiac, pulm, gi, msk   Co morbidities that complicate the patient evaluation   htn, gerd, and high cholesterol.   Additional history  obtained:  Additional history obtained from epic chart review  Lab Tests:  I Ordered, and personally interpreted labs.  The pertinent results include:  cbc nl, preg neg, bmp nl, trop nl   Imaging Studies ordered:  I ordered imaging studies including cxr  I independently visualized and interpreted imaging which showed No active cardiopulmonary disease.  I agree with the radiologist interpretation   Cardiac Monitoring:  The patient was maintained on a  cardiac monitor.  I personally viewed and interpreted the cardiac monitored which showed an underlying rhythm of: nsr   Medicines ordered and prescription drug management:  I ordered medication including toradol  for sx  Reevaluation of the patient after these medicines showed that the patient improved I have reviewed the patients home medicines and have made adjustments as needed   Test Considered:  cxr  Problem List / ED Course:  CP:  atypical with normal coronary arteries on ct scan in April.  Neg EKG.  Neg ddimer.  Neg trop times 2.  Pt is stable for d/c. She is to return if worse.  F/u with pcp.   Reevaluation:  After the interventions noted above, I reevaluated the patient and found that they have :improved   Social Determinants of Health:  Lives at home   Dispostion:  After consideration of the diagnostic results and the patients response to treatment, I feel that the patent would benefit from discharge with outpatient f/u.          Final Clinical Impression(s) / ED Diagnoses Final diagnoses:  Nonspecific chest pain    Rx / DC Orders ED Discharge Orders     None         Jacalyn Lefevre, MD 12/10/22 1227

## 2022-12-10 NOTE — ED Triage Notes (Signed)
Pt c.o central chest pain for the past 5 days. Pt had similar episode in March of this year and was started on Crestor. Pt denies any other associated symptoms.

## 2022-12-10 NOTE — ED Notes (Signed)
Patient Alert and oriented to baseline. Stable and ambulatory to baseline. Patient verbalized understanding of the discharge instructions.  Patient belongings were taken by the patient.   

## 2023-02-07 DIAGNOSIS — Z01419 Encounter for gynecological examination (general) (routine) without abnormal findings: Secondary | ICD-10-CM | POA: Diagnosis not present

## 2023-02-10 ENCOUNTER — Other Ambulatory Visit: Payer: Self-pay

## 2023-02-10 DIAGNOSIS — Z1231 Encounter for screening mammogram for malignant neoplasm of breast: Secondary | ICD-10-CM

## 2023-02-13 ENCOUNTER — Other Ambulatory Visit: Payer: Self-pay | Admitting: Obstetrics and Gynecology

## 2023-02-13 ENCOUNTER — Other Ambulatory Visit: Payer: Self-pay

## 2023-02-13 DIAGNOSIS — Z1231 Encounter for screening mammogram for malignant neoplasm of breast: Secondary | ICD-10-CM

## 2023-02-17 ENCOUNTER — Encounter: Payer: Self-pay | Admitting: Nurse Practitioner

## 2023-02-17 ENCOUNTER — Ambulatory Visit: Payer: BC Managed Care – PPO | Attending: Nurse Practitioner | Admitting: Emergency Medicine

## 2023-02-17 VITALS — BP 142/84 | HR 94 | Ht 63.0 in | Wt 182.8 lb

## 2023-02-17 DIAGNOSIS — R072 Precordial pain: Secondary | ICD-10-CM

## 2023-02-17 DIAGNOSIS — I479 Paroxysmal tachycardia, unspecified: Secondary | ICD-10-CM

## 2023-02-17 DIAGNOSIS — I10 Essential (primary) hypertension: Secondary | ICD-10-CM | POA: Diagnosis not present

## 2023-02-17 DIAGNOSIS — I2510 Atherosclerotic heart disease of native coronary artery without angina pectoris: Secondary | ICD-10-CM | POA: Diagnosis not present

## 2023-02-17 LAB — COMPREHENSIVE METABOLIC PANEL
ALT: 59 [IU]/L — ABNORMAL HIGH (ref 0–32)
AST: 63 [IU]/L — ABNORMAL HIGH (ref 0–40)
Albumin: 4.4 g/dL (ref 3.9–4.9)
Alkaline Phosphatase: 88 [IU]/L (ref 44–121)
BUN/Creatinine Ratio: 19 (ref 9–23)
BUN: 10 mg/dL (ref 6–24)
Bilirubin Total: 0.5 mg/dL (ref 0.0–1.2)
CO2: 18 mmol/L — ABNORMAL LOW (ref 20–29)
Calcium: 10 mg/dL (ref 8.7–10.2)
Chloride: 103 mmol/L (ref 96–106)
Creatinine, Ser: 0.54 mg/dL — ABNORMAL LOW (ref 0.57–1.00)
Globulin, Total: 2.5 g/dL (ref 1.5–4.5)
Glucose: 138 mg/dL — ABNORMAL HIGH (ref 70–99)
Potassium: 4 mmol/L (ref 3.5–5.2)
Sodium: 140 mmol/L (ref 134–144)
Total Protein: 6.9 g/dL (ref 6.0–8.5)
eGFR: 116 mL/min/{1.73_m2} (ref >59)

## 2023-02-17 MED ORDER — ROSUVASTATIN CALCIUM 20 MG PO TABS: 20.0000 mg | ORAL_TABLET | Freq: Every day | ORAL | 3 refills | Status: DC

## 2023-02-17 NOTE — Progress Notes (Signed)
 Cardiology Office Note:    Date:  02/17/2023  ID:  Terissa Haffey, DOB Aug 01, 1975, MRN [id number] PCP: [facility] Providers Cardiologist:  Redell Shallow, MD       Patient Profile:      Modell Fendrick is a 48 year old female with visit pertinent history of hypertension.  She established with cardiology service in March 2024 for evaluation of chest pain.  Echocardiogram was ordered and completed on 05/2022 showing LVEF 70-75%, no RWMA, normal diastolic parameters, RV SF normal, trivial mitral valve regurgitation.  Coronary CTA completed in 05/2022 showing coronary calcium  score of 0 with normal coronary arteries.  She did have a CT angio chest on 04/2022 showing no PE or other chest pathology.  She was recently seen in the ED on 12/10/2022 for evaluation of chest pain.  She had noted ongoing CP for 5 days that was described as a tickle in her chest.  High-sensitivity troponins were unremarkable at 3/3.  EKG without any ST or T wave changes.  Patient was given a dose of Toradol  and discharged home.      History of Present Illness:  Discussed the use of AI scribe software for clinical note transcription with the patient, who gave verbal consent to proceed.  Mackie Goon is a 48 y.o. female who returns for evaluation of chest pain.   She presents for follow-up after a recent ED visit for chest pain. 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 The patient has been experiencing intermittent chest discomfort described as a tickle sensation, often triggered by leaning to the left, particularly when sitting at a desk while at work. The pain is brief and comes and goes. The patient has been experiencing this pain on and off since March. The patient reports no pain during a recent two-week break from work, during which she was not sitting at a desk or actively teaching. The patient denies any palpitations. The patient has been taking Crestor   (rosuvastatin ) since a previous visit with Doctor Shallow in March for mild TPV noted on coronary CTA. The patient's heart rate was noted to be high during the current visit, which the patient attributes to her anxiety. The patient's blood pressure was also noted to be slightly high.  She denies shortness of breath, lower extremity edema, fatigue, palpitations, hemoptysis, diaphoresis, weakness, presyncope, syncope, orthopnea, and PND.     Review of Systems  Constitutional: Negative for weight gain and weight loss.  Cardiovascular:  Positive for chest pain. Negative for claudication, dyspnea on exertion, irregular heartbeat, leg swelling, near-syncope, orthopnea, palpitations, paroxysmal nocturnal dyspnea and syncope.  Respiratory:  Negative for cough, hemoptysis and shortness of breath.   Gastrointestinal:  Negative for abdominal pain, hematochezia and melena.  Genitourinary:  Negative for hematuria.  Neurological:  Negative for dizziness and light-headedness.     See HPI    Studies Reviewed:   EKG Interpretation Date/Time:  Friday February 17 2023 09:13:30 EST Ventricular Rate:  115 PR Interval:  148 QRS Duration:  72 QT Interval:  298 QTC Calculation: 412 R Axis:   23  Text Interpretation: Sinus tachycardia Possible Left atrial enlargement No significant change was found Confirmed by Rana Dixon [phone]) on 02/17/2023 10:26:48 AM    Echocardiogram 06/06/2022 1. Global longitudinal strain is normal at -22.8%. Left ventricular  ejection fraction, by estimation, is 70 to 75%. The left ventricle has  hyperdynamic function. The left ventricle has no regional wall motion  abnormalities. Left ventricular diastolic  parameters were normal.   2. Right ventricular systolic function is normal. The right ventricular  size is normal.   3. The mitral valve is normal in structure. Trivial mitral valve  regurgitation.   4. The aortic valve is normal in structure. Aortic valve regurgitation  is  not visualized.   5. The inferior vena cava is normal in size with greater than 50%  respiratory variability, suggesting right atrial pressure of 3 mmHg.   Coronary CTA 06/06/2022 IMPRESSION: 1. Coronary calcium  score of 0. This was 0 percentile for age-, sex, and race-matched controls.   2. Total plaque volume 5 mm3 which is 48th percentile for age- and sex-matched controls (calcified plaque 0 mm3; non-calcified plaque 5 mm3). TPV is mild.   2. Normal coronary origin with right dominance.   3. Normal coronary arteries.  CAD-RADS 0.   4.  Consider non-cardiac causes of chest pain. Risk Assessment/Calculations:     HYPERTENSION CONTROL Vitals:   02/17/23 0908 02/17/23 1005  BP: (!) 146/90 (!) 142/84    The patient's blood pressure is elevated above target today.  In order to address the patient's elevated BP: Blood pressure will be monitored at home to determine if medication changes need to be made.          Physical Exam:   VS:  BP (!) 142/84 (BP Location: Left Arm, Patient Position: Sitting, Cuff Size: Normal)   Pulse 94   Ht 5' 3 (1.6 m)   Wt 182 lb 12.8 oz (82.9 kg)   BMI 32.38 kg/m    Wt Readings from Last 3 Encounters:  02/17/23 182 lb 12.8 oz (82.9 kg)  12/10/22 155 lb (70.3 kg)  05/07/22 180 lb 5.4 oz (81.8 kg)    Constitutional:      Appearance: Normal and healthy appearance.  HENT:     Head: Normocephalic.  Neck:     Vascular: JVD normal.  Pulmonary:     Effort: Pulmonary effort is normal.     Breath sounds: Normal breath sounds.  Chest:     Chest wall: Not tender to palpatation.  Cardiovascular:     PMI at left midclavicular line. Normal rate. Regular rhythm. Normal S1. Normal S2.      Murmurs: There is no murmur.     No gallop.  No click. No rub.  Pulses:    Intact distal pulses.  Edema:    Peripheral edema absent.  Musculoskeletal: Normal range of motion.     Cervical back: Normal range of motion and neck supple. Skin:    General:  Skin is warm and dry.  Neurological:     General: No focal deficit present.     Mental Status: Alert, oriented to person, place, and time and oriented to person, place and time.  Psychiatric:        Attention and Perception: Attention and perception normal.        Mood and Affect: Mood normal.        Behavior: Behavior is cooperative.        Thought Content: Thought content normal.        Assessment and Plan:      Atypical Chest Discomfort Intermittent, brief, non-exertional, positional chest disocmfort while at rest for 9 months. Much improved over last two weeks since she has not been at work or sitting at her desk. Normal echocardiogram in 05/2022 and coronary CTA showing CAC score of 0 with mild total plaque volume of 5 mm3. EKG today sinus tachycardia  115 bmp with no ST-T wave changes. Tachycardia improved to 90s during visit which she attributed to her anxiety. Her CP likely musculoskeletal in nature given the positional component. Her most recent LDL is 57 and under excellent control.  -Continue Rosuvastatin  20mg  daily. Will check LFTs today, denies myalgias.  -Re-evaluate in 6 months or sooner if chest pain worsens or tachycardia persists.   Hypertension Blood pressure elevated at today's visit and at recent OB/GYN appointment. BP today at 146/90 and repeat 142/84.  -Recommend patient to purchase BP cuff and monitor blood pressure at home and if consistently above 130/80, patient to notify office for possible initiation of antihypertensive therapy. Focus on heart healthy dieting and preventing processed foods in order to lower blood pressure.   Tachycardia -HR today 115 bmp on EKG and during reassessment was 96 bpm on auscultation. I have reviewed her past EKGs with 3/4 showing sinus tach. She attributes her fast HR as situational attributed to anxiety. I will have her monitor her HR at home, if HR persistently over 100 bmp consider ZIO monitor or beta-blocker therapy.    Follow-up -Return to clinic in 6 months for re-evaluation of chest pain and tachycardia. -Check liver function tests today due to slightly elevated AST and ALT in October. -If blood pressure consistently above 130/80 and HR >100 bmp at home, patient to notify office.         Dispo:  Return in about 6 months (around 08/17/2023).  Signed, Lum LITTIE Louis, NP

## 2023-02-17 NOTE — Patient Instructions (Addendum)
 Medication Instructions:  Continue current medications *If you need a refill on your cardiac medications before your next appointment, please call your pharmacy*   Lab Work: CMET today If you have labs (blood work) drawn today and your tests are completely normal, you will receive your results only by: MyChart Message (if you have MyChart) OR A paper copy in the mail If you have any lab test that is abnormal or we need to change your treatment, we will call you to review the results.   Testing/Procedures: None   Follow-Up: At [facility], you and your health needs are our priority.  As part of our continuing mission to provide you with exceptional heart care, we have created designated Provider Care Teams.  These Care Teams include your primary Cardiologist (physician) and Advanced Practice Providers (APPs -  Physician Assistants and Nurse Practitioners) who all work together to provide you with the care you need, when you need it.  We recommend signing up for the patient portal called MyChart.  Sign up information is provided on this After Visit Summary.  MyChart is used to connect with patients for Virtual Visits (Telemedicine).  Patients are able to view lab/test results, encounter notes, upcoming appointments, etc.  Non-urgent messages can be sent to your provider as well.   To learn more about what you can do with MyChart, go to [email or web address].    Your next appointment:   6 month(s)  Provider:   Redell Shallow, MD     Other Instructions none

## 2023-03-02 DIAGNOSIS — Z1231 Encounter for screening mammogram for malignant neoplasm of breast: Secondary | ICD-10-CM

## 2023-05-11 ENCOUNTER — Ambulatory Visit
Admission: RE | Admit: 2023-05-11 | Discharge: 2023-05-11 | Disposition: A | Discharge status: Home / Self Care | Source: Ambulatory Visit | Attending: Diagnostic Radiology | Admitting: Diagnostic Radiology

## 2023-05-11 DIAGNOSIS — Z1231 Encounter for screening mammogram for malignant neoplasm of breast: Secondary | ICD-10-CM | POA: Diagnosis not present

## 2023-07-18 NOTE — Progress Notes (Deleted)
 HPI: FU CP.  CTA March 2024 showed no pulmonary embolus.  Echocardiogram April 2024 showed normal LV function and no significant valvular disease.  Coronary CTA April 2024 showed calcium  score 0, total plaque volume 5 mm which is 48th percentile and normal coronary arteries.  Since last seen  Current Outpatient Medications  Medication Sig Dispense Refill   cetirizine  (ZYRTEC  ALLERGY) 10 MG tablet Take 1 tablet (10 mg total) by mouth at bedtime. 90 tablet 1   fluconazole  (DIFLUCAN ) 150 MG tablet Take 1 tablet today.  Take second tablet 3 days later. 2 tablet 5   Fluocinolone  Acetonide (DERMOTIC ) 0.01 % OIL Place 5 drops in ear(s) 2 (two) times daily as needed (Itching in ears). 20 mL 2   ipratropium (ATROVENT ) 0.06 % nasal spray Place 2 sprays into both nostrils 3 (three) times daily. As needed for nasal congestion, runny nose 15 mL 2   levonorgestrel-ethinyl estradiol (ALESSE) 0.1-20 MG-MCG tablet Take 1 tablet by mouth daily.     metoprolol  tartrate (LOPRESSOR ) 100 MG tablet TAKE 2 HOURS PRIOR TO CT SCAN 1 tablet 0   mometasone  (NASONEX ) 50 MCG/ACT nasal spray Place 2 sprays into the nose daily. 17 g 5   montelukast  (SINGULAIR ) 10 MG tablet Take 1 tablet (10 mg total) by mouth at bedtime. 30 tablet 5   omeprazole  (PRILOSEC) 20 MG capsule Take 1 capsule (20 mg total) by mouth daily. 30 capsule 0   rosuvastatin  (CRESTOR ) 20 MG tablet Take 1 tablet (20 mg total) by mouth daily. 90 tablet 3   No current facility-administered medications for this visit.     Past Medical History:  Diagnosis Date   Hypertension    mild no meds   Seasonal allergies     Past Surgical History:  Procedure Laterality Date   CERVICAL CONIZATION W/BX N/A 06/03/2015   Procedure: CONIZATION CERVIX WITH BIOPSY;  Surgeon: Arlee Lace, MD;  Location: [facility];  Service: Gynecology;  Laterality: N/A;    Social History   Socioeconomic History   Marital status: Divorced    Spouse name: Not on file   Number of  children: 1   Years of education: Not on file   Highest education level: Not on file  Occupational History   Not on file  Tobacco Use   Smoking status: Never   Smokeless tobacco: Never  Substance and Sexual Activity   Alcohol use: Yes    Comment: occasionally   Drug use: No   Sexual activity: Yes    Birth control/protection: Pill  Other Topics Concern   Not on file  Social History Narrative   Not on file   Social Drivers of Health   Financial Resource Strain: Not on file  Food Insecurity: Not on file  Transportation Needs: Not on file  Physical Activity: Not on file  Stress: Not on file  Social Connections: Not on file  Intimate Partner Violence: Not on file    Family History  Problem Relation Age of Onset   Hypertension Father    Heart attack Father    Hyperlipidemia Brother    Hypertension Brother     ROS: no fevers or chills, productive cough, hemoptysis, dysphasia, odynophagia, melena, hematochezia, dysuria, hematuria, rash, seizure activity, orthopnea, PND, pedal edema, claudication. Remaining systems are negative.  Physical Exam: Well-developed well-nourished in no acute distress.  Skin is warm and dry.  HEENT is normal.  Neck is supple.  Chest is clear to auscultation with normal expansion.  Cardiovascular exam  is regular rate and rhythm.  Abdominal exam nontender or distended. No masses palpated. Extremities show no edema. neuro grossly intact  ECG- personally reviewed  A/P  1 chest pain-symptoms are atypical.  Previous coronary CTA showed no obstructive coronary disease.  Electrocardiogram is unchanged.  We will not pursue further ischemia evaluation.  2 hypertension-patient's blood pressure is controlled.  Continue present medical regimen.  3 hyperlipidemia-continue Crestor .  Alexandria Angel, MD

## 2023-08-01 ENCOUNTER — Ambulatory Visit: Payer: BC Managed Care – PPO | Admitting: Cardiology

## 2023-09-08 DIAGNOSIS — H01001 Unspecified blepharitis right upper eyelid: Secondary | ICD-10-CM | POA: Diagnosis not present

## 2023-09-08 DIAGNOSIS — H52223 Regular astigmatism, bilateral: Secondary | ICD-10-CM | POA: Diagnosis not present

## 2023-09-08 DIAGNOSIS — H35362 Drusen (degenerative) of macula, left eye: Secondary | ICD-10-CM | POA: Diagnosis not present

## 2023-09-08 DIAGNOSIS — H35373 Puckering of macula, bilateral: Secondary | ICD-10-CM | POA: Diagnosis not present

## 2023-09-08 DIAGNOSIS — H5213 Myopia, bilateral: Secondary | ICD-10-CM | POA: Diagnosis not present

## 2023-09-08 DIAGNOSIS — H01004 Unspecified blepharitis left upper eyelid: Secondary | ICD-10-CM | POA: Diagnosis not present

## 2023-09-08 DIAGNOSIS — H35342 Macular cyst, hole, or pseudohole, left eye: Secondary | ICD-10-CM | POA: Diagnosis not present

## 2023-09-08 DIAGNOSIS — H524 Presbyopia: Secondary | ICD-10-CM | POA: Diagnosis not present

## 2023-09-08 DIAGNOSIS — B880 Other acariasis: Secondary | ICD-10-CM | POA: Diagnosis not present

## 2023-11-07 DIAGNOSIS — H01004 Unspecified blepharitis left upper eyelid: Secondary | ICD-10-CM | POA: Diagnosis not present

## 2023-11-07 DIAGNOSIS — H01001 Unspecified blepharitis right upper eyelid: Secondary | ICD-10-CM | POA: Diagnosis not present

## 2023-11-07 DIAGNOSIS — B880 Other acariasis: Secondary | ICD-10-CM | POA: Diagnosis not present

## 2023-11-22 DIAGNOSIS — L299 Pruritus, unspecified: Secondary | ICD-10-CM | POA: Diagnosis not present

## 2023-11-22 DIAGNOSIS — B372 Candidiasis of skin and nail: Secondary | ICD-10-CM | POA: Diagnosis not present

## 2024-02-16 ENCOUNTER — Other Ambulatory Visit: Payer: Self-pay | Admitting: Emergency Medicine

## 2024-02-16 DIAGNOSIS — I10 Essential (primary) hypertension: Secondary | ICD-10-CM

## 2024-03-18 ENCOUNTER — Other Ambulatory Visit: Payer: Self-pay | Admitting: Cardiology

## 2024-03-18 DIAGNOSIS — I10 Essential (primary) hypertension: Secondary | ICD-10-CM

## 2024-03-19 MED ORDER — ROSUVASTATIN CALCIUM 20 MG PO TABS: 20.0000 mg | ORAL_TABLET | Freq: Every day | ORAL | 0 refills | Status: AC
